# Patient Record
Sex: Female | Born: 1980 | Race: White | Hispanic: No | Marital: Married | Smoking: Former smoker
Health system: Southern US, Community
[De-identification: ages and names within clinical notes are randomized; demographics above are authoritative.]

## PROBLEM LIST (undated history)

## (undated) DIAGNOSIS — I10 Essential (primary) hypertension: Secondary | ICD-10-CM

## (undated) DIAGNOSIS — D649 Anemia, unspecified: Secondary | ICD-10-CM

## (undated) DIAGNOSIS — N3010 Interstitial cystitis (chronic) without hematuria: Secondary | ICD-10-CM

## (undated) HISTORY — DX: Anemia, unspecified: D64.9

## (undated) HISTORY — PX: OTHER SURGICAL HISTORY: SHX169

## (undated) HISTORY — DX: Essential (primary) hypertension: I10

## (undated) HISTORY — DX: Interstitial cystitis (chronic) without hematuria: N30.10

---
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
## 2000-07-24 ENCOUNTER — Other Ambulatory Visit: Admission: RE | Admit: 2000-07-24 | Discharge: 2000-07-24 | Payer: Self-pay | Admitting: Obstetrics and Gynecology

## 2004-07-14 ENCOUNTER — Other Ambulatory Visit: Admission: RE | Admit: 2004-07-14 | Discharge: 2004-07-14 | Payer: Self-pay | Admitting: Obstetrics and Gynecology

## 2005-09-13 ENCOUNTER — Encounter: Admission: RE | Admit: 2005-09-13 | Discharge: 2005-09-13 | Payer: Self-pay | Admitting: Emergency Medicine

## 2009-05-05 ENCOUNTER — Ambulatory Visit (HOSPITAL_BASED_OUTPATIENT_CLINIC_OR_DEPARTMENT_OTHER): Admission: RE | Admit: 2009-05-05 | Discharge: 2009-05-05 | Payer: Self-pay | Admitting: Urology

## 2010-03-22 LAB — POCT PREGNANCY, URINE

## 2010-06-23 ENCOUNTER — Other Ambulatory Visit (HOSPITAL_COMMUNITY): Payer: Self-pay | Admitting: Oncology

## 2010-06-23 ENCOUNTER — Ambulatory Visit (HOSPITAL_COMMUNITY)
Admission: RE | Admit: 2010-06-23 | Discharge: 2010-06-23 | Disposition: A | Discharge status: Home / Self Care | Payer: PRIVATE HEALTH INSURANCE | Source: Ambulatory Visit | Attending: Oncology | Admitting: Oncology

## 2010-06-23 ENCOUNTER — Encounter (HOSPITAL_BASED_OUTPATIENT_CLINIC_OR_DEPARTMENT_OTHER): Payer: PRIVATE HEALTH INSURANCE | Admitting: Oncology

## 2010-06-23 DIAGNOSIS — R0602 Shortness of breath: Secondary | ICD-10-CM | POA: Insufficient documentation

## 2010-06-23 DIAGNOSIS — R000 Tachycardia, unspecified: Secondary | ICD-10-CM

## 2010-06-23 DIAGNOSIS — J984 Other disorders of lung: Secondary | ICD-10-CM | POA: Insufficient documentation

## 2010-06-23 DIAGNOSIS — D649 Anemia, unspecified: Secondary | ICD-10-CM

## 2010-06-23 LAB — CBC & DIFF AND RETIC
Eosinophils Absolute: 3.1 10*3/uL — ABNORMAL HIGH (ref 0.0–0.5)
MCV: 102.6 fL — ABNORMAL HIGH (ref 79.5–101.0)
MONO%: 6.2 % (ref 0.0–14.0)
NEUT#: 6.5 10*3/uL (ref 1.5–6.5)
RBC: 3.06 10*6/uL — ABNORMAL LOW (ref 3.70–5.45)
RDW: 17.8 % — ABNORMAL HIGH (ref 11.2–14.5)
Retic %: 8.1 % — ABNORMAL HIGH (ref 0.50–1.50)
Retic Ct Abs: 247.86 10*3/uL — ABNORMAL HIGH (ref 18.30–72.70)
WBC: 12.3 10*3/uL — ABNORMAL HIGH (ref 3.9–10.3)
lymph#: 1.9 10*3/uL (ref 0.9–3.3)

## 2010-06-23 LAB — COMPREHENSIVE METABOLIC PANEL
AST: 28 U/L (ref 0–37)
Albumin: 3.8 g/dL (ref 3.5–5.2)
Alkaline Phosphatase: 62 U/L (ref 39–117)
Potassium: 3.4 mEq/L — ABNORMAL LOW (ref 3.5–5.3)
Sodium: 137 mEq/L (ref 135–145)
Total Protein: 6.1 g/dL (ref 6.0–8.3)

## 2010-06-23 LAB — MORPHOLOGY: PLT EST: ADEQUATE

## 2010-06-23 LAB — CHCC SMEAR

## 2010-06-23 MED ORDER — IOHEXOL 300 MG/ML  SOLN
100.0000 mL | Freq: Once | INTRAMUSCULAR | Status: AC | PRN
  Administered 2010-06-23: 100 mL via INTRAVENOUS

## 2010-06-28 ENCOUNTER — Encounter (HOSPITAL_BASED_OUTPATIENT_CLINIC_OR_DEPARTMENT_OTHER): Payer: PRIVATE HEALTH INSURANCE | Admitting: Oncology

## 2010-06-28 ENCOUNTER — Other Ambulatory Visit (HOSPITAL_COMMUNITY): Payer: Self-pay | Admitting: Oncology

## 2010-06-28 DIAGNOSIS — D649 Anemia, unspecified: Secondary | ICD-10-CM

## 2010-06-28 LAB — CBC & DIFF AND RETIC
Basophils Absolute: 0 10*3/uL (ref 0.0–0.1)
EOS%: 29.3 % — ABNORMAL HIGH (ref 0.0–7.0)
HCT: 28.4 % — ABNORMAL LOW (ref 34.8–46.6)
HGB: 9 g/dL — ABNORMAL LOW (ref 11.6–15.9)
Immature Retic Fract: 9.8 % (ref 0.00–10.70)
MCH: 32.1 pg (ref 25.1–34.0)
MCV: 101.4 fL — ABNORMAL HIGH (ref 79.5–101.0)
MONO%: 6.6 % (ref 0.0–14.0)
NEUT%: 42.7 % (ref 38.4–76.8)
lymph#: 2 10*3/uL (ref 0.9–3.3)

## 2010-06-28 LAB — COMPREHENSIVE METABOLIC PANEL
ALT: 28 U/L (ref 0–35)
Albumin: 3.6 g/dL (ref 3.5–5.2)
Alkaline Phosphatase: 54 U/L (ref 39–117)
CO2: 25 mEq/L (ref 19–32)
Glucose, Bld: 104 mg/dL — ABNORMAL HIGH (ref 70–99)
Potassium: 3.5 mEq/L (ref 3.5–5.3)
Sodium: 135 mEq/L (ref 135–145)
Total Bilirubin: 0.9 mg/dL (ref 0.3–1.2)
Total Protein: 6.5 g/dL (ref 6.0–8.3)

## 2010-06-28 LAB — IRON AND TIBC
TIBC: 226 ug/dL — ABNORMAL LOW (ref 250–470)
UIBC: 172 ug/dL

## 2010-06-28 LAB — MORPHOLOGY

## 2010-06-28 LAB — IMMUNOFIXATION ELECTROPHORESIS

## 2010-06-28 LAB — LACTATE DEHYDROGENASE: LDH: 545 U/L — ABNORMAL HIGH (ref 94–250)

## 2010-06-29 LAB — DIRECT ANTIGLOBULIN TEST (NOT AT ARMC): DAT IgG: NEGATIVE

## 2010-06-29 LAB — FOLATE RBC: RBC Folate: 2529 ng/mL (ref >=366)

## 2010-06-29 LAB — HAPTOGLOBIN: Haptoglobin: 7 mg/dL — ABNORMAL LOW (ref 30–200)

## 2010-07-01 ENCOUNTER — Encounter (HOSPITAL_BASED_OUTPATIENT_CLINIC_OR_DEPARTMENT_OTHER): Payer: PRIVATE HEALTH INSURANCE | Admitting: Oncology

## 2010-07-01 ENCOUNTER — Encounter (HOSPITAL_COMMUNITY)
Admission: RE | Admit: 2010-07-01 | Discharge: 2010-07-01 | Disposition: A | Discharge status: Home / Self Care | Payer: PRIVATE HEALTH INSURANCE | Source: Ambulatory Visit | Attending: Oncology | Admitting: Oncology

## 2010-07-01 ENCOUNTER — Other Ambulatory Visit (HOSPITAL_COMMUNITY): Payer: Self-pay | Admitting: Oncology

## 2010-07-01 ENCOUNTER — Telehealth: Payer: Self-pay

## 2010-07-01 DIAGNOSIS — D599 Acquired hemolytic anemia, unspecified: Secondary | ICD-10-CM | POA: Insufficient documentation

## 2010-07-01 DIAGNOSIS — D649 Anemia, unspecified: Secondary | ICD-10-CM

## 2010-07-01 LAB — CBC & DIFF AND RETIC
BASO%: 0.3 % (ref 0.0–2.0)
EOS%: 27.3 % — ABNORMAL HIGH (ref 0.0–7.0)
HCT: 26.2 % — ABNORMAL LOW (ref 34.8–46.6)
Immature Retic Fract: 19.2 % — ABNORMAL HIGH (ref 0.00–10.70)
LYMPH%: 13 % — ABNORMAL LOW (ref 14.0–49.7)
MCH: 31.7 pg (ref 25.1–34.0)
MCHC: 31.3 g/dL — ABNORMAL LOW (ref 31.5–36.0)
MCV: 101.2 fL — ABNORMAL HIGH (ref 79.5–101.0)
MONO%: 4.8 % (ref 0.0–14.0)
NEUT%: 54.6 % (ref 38.4–76.8)
Platelets: 236 10*3/uL (ref 145–400)
RBC: 2.59 10*6/uL — ABNORMAL LOW (ref 3.70–5.45)
WBC: 11.9 10*3/uL — ABNORMAL HIGH (ref 3.9–10.3)
nRBC: 0 % (ref 0–0)

## 2010-07-01 LAB — MORPHOLOGY

## 2010-07-01 NOTE — Telephone Encounter (Signed)
Left message for Suzanne Mendoza to call back and confirm that she did get the appt set up for patient on Monday 07-04-2010 at 245pm with Dr Vassie Loll

## 2010-07-04 ENCOUNTER — Other Ambulatory Visit (HOSPITAL_COMMUNITY): Payer: Self-pay | Admitting: Oncology

## 2010-07-04 ENCOUNTER — Ambulatory Visit: Payer: PRIVATE HEALTH INSURANCE | Admitting: Pulmonary Disease

## 2010-07-04 ENCOUNTER — Ambulatory Visit (INDEPENDENT_AMBULATORY_CARE_PROVIDER_SITE_OTHER): Payer: PRIVATE HEALTH INSURANCE | Admitting: Pulmonary Disease

## 2010-07-04 ENCOUNTER — Encounter (HOSPITAL_BASED_OUTPATIENT_CLINIC_OR_DEPARTMENT_OTHER): Payer: PRIVATE HEALTH INSURANCE | Admitting: Oncology

## 2010-07-04 ENCOUNTER — Encounter: Payer: Self-pay | Admitting: Pulmonary Disease

## 2010-07-04 VITALS — BP 110/68 | HR 110 | Temp 98.2°F | Ht 63.5 in | Wt 140.0 lb

## 2010-07-04 DIAGNOSIS — D649 Anemia, unspecified: Secondary | ICD-10-CM

## 2010-07-04 DIAGNOSIS — R0902 Hypoxemia: Secondary | ICD-10-CM

## 2010-07-04 LAB — BASIC METABOLIC PANEL
CO2: 26 mEq/L (ref 19–32)
Chloride: 105 mEq/L (ref 96–112)
Creatinine, Ser: 0.6 mg/dL (ref 0.50–1.10)
Glucose, Bld: 134 mg/dL — ABNORMAL HIGH (ref 70–99)
Sodium: 138 mEq/L (ref 135–145)

## 2010-07-04 LAB — CBC & DIFF AND RETIC
BASO%: 0.4 % (ref 0.0–2.0)
Basophils Absolute: 0 10*3/uL (ref 0.0–0.1)
EOS%: 4.5 % (ref 0.0–7.0)
Eosinophils Absolute: 0.4 10*3/uL (ref 0.0–0.5)
HGB: 8.4 g/dL — ABNORMAL LOW (ref 11.6–15.9)
LYMPH%: 26.8 % (ref 14.0–49.7)
MCV: 102.6 fL — ABNORMAL HIGH (ref 79.5–101.0)
MONO#: 0.4 10*3/uL (ref 0.1–0.9)
MONO%: 4.9 % (ref 0.0–14.0)
NEUT#: 5.8 10*3/uL (ref 1.5–6.5)
Platelets: 274 10*3/uL (ref 145–400)
RBC: 2.5 10*6/uL — ABNORMAL LOW (ref 3.70–5.45)
Retic Ct Abs: 199.75 10*3/uL — ABNORMAL HIGH (ref 18.30–72.70)
WBC: 9.1 10*3/uL (ref 3.9–10.3)

## 2010-07-04 NOTE — Progress Notes (Incomplete Revision)
?  Subjective:  ? ? Patient ID: Suzanne Mendoza, female    DOB: 05/03/1980, 30 y.o.   MRN: [id number] ? ?HPI ?30/F, non smoker, undergoing work up for  hemolytic anemia is referred for evaluation of hypoxia on pulse oximetry. 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 Of note ANa is negative. DAT , igG & complement were negative. Resting pulse oximeter has been documented low & is at 91% today. Of note Ct angio neg 06/23/10. She underwent cystoscopy by Dr Ovidio Hanger for interstitial cystitis in may'11. ?She was evaluated for asthma 7-8 years ago & had normal PFTs at that time by report. ?She denies wheezing or cough. She reports dyspnea on activity but no pedal edema, palpitations, cyanosis or chest pain. There is no history of frequent LRTIs ?ABG shows nml pO2, methhemoglobin was 5.4, carboxy 1.1 ? ?Review of Systems  ?Constitutional: Positive for unexpected weight change. Negative for fever.  ?HENT: Negative for ear pain, nosebleeds, congestion, sore throat, rhinorrhea, sneezing, trouble swallowing, dental problem, postnasal drip and sinus pressure.   ?Eyes: Negative for redness and itching.  ?Respiratory: Positive for cough and shortness of breath. Negative for chest tightness and wheezing.   ?Cardiovascular: Positive for palpitations. Negative for leg swelling.  ?Gastrointestinal: Positive for abdominal pain. Negative for nausea and vomiting.  ?Genitourinary: Negative for dysuria.  ?Musculoskeletal: Positive for joint swelling.  ?Skin: Negative for rash.  ?Neurological: Positive for headaches.  ?Hematological: Does not bruise/bleed easily.  ?Psychiatric/Behavioral: Negative for dysphoric mood. The patient is nervous/anxious.   ? ? ?   ?Objective:  ? Physical Exam ?Gen. Pleasant, well-nourished, in no distress, normal affect ?ENT - no lesions, no post nasal drip, pallor + ?Neck: No JVD, no thyromegaly, no carotid bruits ? Lungs: no use of accessory muscles, no dullness to percussion, clear without rales or rhonchi  ?Cardiovascular: Rhy

## 2010-07-04 NOTE — Progress Notes (Addendum)
  Subjective:    Patient ID: Suzanne Mendoza, female    DOB: 1980/09/07, 30 y.o.   MRN: [id number]  HPI 30/F, non smoker, undergoing work up for  hemolytic anemia is referred for evaluation of hypoxia on pulse oximetry. 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 Of note ANa is negative. DAT , igG & complement were negative. Resting pulse oximeter has been documented low & is at 91% today. Of note Ct angio neg 06/23/10. She underwent cystoscopy by Dr Ovidio Hanger for interstitial cystitis in may'11. She was evaluated for asthma 7-8 years ago & had normal PFTs at that time by report. She denies wheezing or cough. She reports dyspnea on activity but no pedal edema, palpitations, cyanosis or chest pain. There is no history of frequent LRTIs ABG shows nml pO2, methhemoglobin was 5.4, carboxy 1.1  Review of Systems  Constitutional: Positive for unexpected weight change. Negative for fever.  HENT: Negative for ear pain, nosebleeds, congestion, sore throat, rhinorrhea, sneezing, trouble swallowing, dental problem, postnasal drip and sinus pressure.   Eyes: Negative for redness and itching.  Respiratory: Positive for cough and shortness of breath. Negative for chest tightness and wheezing.   Cardiovascular: Positive for palpitations. Negative for leg swelling.  Gastrointestinal: Positive for abdominal pain. Negative for nausea and vomiting.  Genitourinary: Negative for dysuria.  Musculoskeletal: Positive for joint swelling.  Skin: Negative for rash.  Neurological: Positive for headaches.  Hematological: Does not bruise/bleed easily.  Psychiatric/Behavioral: Negative for dysphoric mood. The patient is nervous/anxious.        Objective:   Physical Exam Gen. Pleasant, well-nourished, in no distress, normal affect ENT - no lesions, no post nasal drip, pallor + Neck: No JVD, no thyromegaly, no carotid bruits Lungs: no use of accessory muscles, no dullness to percussion,  clear without rales or rhonchi  Cardiovascular: Rhythm regular, heart sounds  normal, no murmurs or gallops, no peripheral edema Abdomen: soft and non-tender, no hepatomegaly, mild 2 finger palpable tender spleen, BS normal. Musculoskeletal: No deformities, no cyanosis or clubbing Neuro:  alert, non focal        Assessment & Plan:

## 2010-07-04 NOTE — Patient Instructions (Signed)
Arterial blood gas test If confirms low oxygen level, may need echo to look for shunt in the heart Follow up based on this test

## 2010-07-06 DIAGNOSIS — R0902 Hypoxemia: Secondary | ICD-10-CM | POA: Insufficient documentation

## 2010-07-06 NOTE — Assessment & Plan Note (Signed)
Low saturation on oximetry is well documented in hemolytic anemias. 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 The simplest way to confirm this would be with a blood gas & co-oximetry - if pO2 is normal it would  Confirm thatt he low measurement is due to abnormal hemoglobins. If on the other hand , pO2 is low then we can consider echo with bubble study to r/o right to left shunt.

## 2010-07-07 ENCOUNTER — Encounter (HOSPITAL_COMMUNITY): Payer: PRIVATE HEALTH INSURANCE

## 2010-07-07 ENCOUNTER — Ambulatory Visit (HOSPITAL_COMMUNITY): Admission: RE | Admit: 2010-07-07 | Payer: PRIVATE HEALTH INSURANCE | Source: Ambulatory Visit

## 2010-07-07 LAB — PNH PROFILE (-HIGH SENSITIVITY): Viability (%): 54 %

## 2010-07-07 LAB — COMPREHENSIVE METABOLIC PANEL
BUN: 12 mg/dL (ref 6–23)
CO2: 23 mEq/L (ref 19–32)
Calcium: 9.1 mg/dL (ref 8.4–10.5)
Chloride: 106 mEq/L (ref 96–112)
Creatinine, Ser: 0.61 mg/dL (ref 0.50–1.10)
Total Bilirubin: 0.9 mg/dL (ref 0.3–1.2)

## 2010-07-07 LAB — LACTATE DEHYDROGENASE: LDH: 419 U/L — ABNORMAL HIGH (ref 94–250)

## 2010-07-08 ENCOUNTER — Other Ambulatory Visit (HOSPITAL_COMMUNITY): Payer: Self-pay | Admitting: Oncology

## 2010-07-08 ENCOUNTER — Ambulatory Visit (HOSPITAL_COMMUNITY)
Admission: RE | Admit: 2010-07-08 | Discharge: 2010-07-08 | Disposition: A | Discharge status: Home / Self Care | Payer: PRIVATE HEALTH INSURANCE | Source: Ambulatory Visit | Attending: Pulmonary Disease | Admitting: Pulmonary Disease

## 2010-07-08 ENCOUNTER — Encounter (HOSPITAL_BASED_OUTPATIENT_CLINIC_OR_DEPARTMENT_OTHER): Payer: PRIVATE HEALTH INSURANCE | Admitting: Oncology

## 2010-07-08 DIAGNOSIS — R0602 Shortness of breath: Secondary | ICD-10-CM | POA: Insufficient documentation

## 2010-07-08 DIAGNOSIS — R161 Splenomegaly, not elsewhere classified: Secondary | ICD-10-CM

## 2010-07-08 DIAGNOSIS — D649 Anemia, unspecified: Secondary | ICD-10-CM

## 2010-07-08 LAB — CBC & DIFF AND RETIC
Basophils Absolute: 0 10*3/uL (ref 0.0–0.1)
EOS%: 5.7 % (ref 0.0–7.0)
LYMPH%: 28 % (ref 14.0–49.7)
MCH: 32.4 pg (ref 25.1–34.0)
MCV: 101.8 fL — ABNORMAL HIGH (ref 79.5–101.0)
MONO%: 8 % (ref 0.0–14.0)
Platelets: 294 10*3/uL (ref 145–400)
RBC: 2.81 10*6/uL — ABNORMAL LOW (ref 3.70–5.45)
RDW: 15.8 % — ABNORMAL HIGH (ref 11.2–14.5)
Retic %: 10.65 % — ABNORMAL HIGH (ref 0.50–1.50)
nRBC: 0 % (ref 0–0)

## 2010-07-08 LAB — LACTATE DEHYDROGENASE: LDH: 347 U/L — ABNORMAL HIGH (ref 94–250)

## 2010-07-08 LAB — CARBOXYHEMOGLOBIN
Carboxyhemoglobin: 1.1 % (ref 0.5–1.5)
O2 Saturation: 93.4 %
Total hemoglobin: 8.9 g/dL — ABNORMAL LOW (ref 12.5–16.0)

## 2010-07-08 LAB — BLOOD GAS, ARTERIAL
Bicarbonate: 23.6 mEq/L (ref 20.0–24.0)
Drawn by: 24601
pCO2 arterial: 36.5 mmHg (ref 35.0–45.0)
pH, Arterial: 7.427 — ABNORMAL HIGH (ref 7.350–7.400)
pO2, Arterial: 93 mmHg (ref 80.0–100.0)

## 2010-07-08 LAB — COMPREHENSIVE METABOLIC PANEL
AST: 19 U/L (ref 0–37)
Albumin: 4.2 g/dL (ref 3.5–5.2)
Alkaline Phosphatase: 57 U/L (ref 39–117)
BUN: 14 mg/dL (ref 6–23)
Potassium: 3.5 mEq/L (ref 3.5–5.3)
Sodium: 139 mEq/L (ref 135–145)
Total Protein: 6.3 g/dL (ref 6.0–8.3)

## 2010-07-08 LAB — TECHNOLOGIST REVIEW

## 2010-07-08 NOTE — Progress Notes (Signed)
Quick Note:  I informed pt of RA's findings and recommendations. Pt verbalized understanding  ______ 

## 2010-07-13 ENCOUNTER — Other Ambulatory Visit (HOSPITAL_COMMUNITY): Payer: PRIVATE HEALTH INSURANCE

## 2010-07-14 ENCOUNTER — Observation Stay (HOSPITAL_COMMUNITY)
Admission: EM | Admit: 2010-07-14 | Discharge: 2010-07-15 | Disposition: A | Discharge status: Home / Self Care | Payer: PRIVATE HEALTH INSURANCE | Attending: Internal Medicine | Admitting: Internal Medicine

## 2010-07-14 DIAGNOSIS — Z79899 Other long term (current) drug therapy: Secondary | ICD-10-CM | POA: Insufficient documentation

## 2010-07-14 DIAGNOSIS — Z01812 Encounter for preprocedural laboratory examination: Secondary | ICD-10-CM | POA: Insufficient documentation

## 2010-07-14 DIAGNOSIS — F411 Generalized anxiety disorder: Secondary | ICD-10-CM | POA: Insufficient documentation

## 2010-07-14 DIAGNOSIS — E876 Hypokalemia: Secondary | ICD-10-CM | POA: Insufficient documentation

## 2010-07-14 DIAGNOSIS — D599 Acquired hemolytic anemia, unspecified: Secondary | ICD-10-CM | POA: Insufficient documentation

## 2010-07-14 DIAGNOSIS — J45909 Unspecified asthma, uncomplicated: Secondary | ICD-10-CM | POA: Insufficient documentation

## 2010-07-14 DIAGNOSIS — IMO0002 Reserved for concepts with insufficient information to code with codable children: Secondary | ICD-10-CM | POA: Insufficient documentation

## 2010-07-14 DIAGNOSIS — M79609 Pain in unspecified limb: Principal | ICD-10-CM | POA: Insufficient documentation

## 2010-07-14 DIAGNOSIS — D72829 Elevated white blood cell count, unspecified: Secondary | ICD-10-CM | POA: Insufficient documentation

## 2010-07-14 LAB — CBC
Hemoglobin: 9.9 g/dL — ABNORMAL LOW (ref 12.0–15.0)
MCH: 32.6 pg (ref 26.0–34.0)
Platelets: 273 10*3/uL (ref 150–400)
RBC: 3.04 MIL/uL — ABNORMAL LOW (ref 3.87–5.11)
WBC: 10.7 10*3/uL — ABNORMAL HIGH (ref 4.0–10.5)

## 2010-07-14 LAB — URINALYSIS, ROUTINE W REFLEX MICROSCOPIC
Bilirubin Urine: NEGATIVE
Leukocytes, UA: NEGATIVE
Nitrite: NEGATIVE
Specific Gravity, Urine: 1.019 (ref 1.005–1.030)
Urobilinogen, UA: 1 mg/dL (ref 0.0–1.0)
pH: 7 (ref 5.0–8.0)

## 2010-07-14 LAB — DIFFERENTIAL
Basophils Absolute: 0 10*3/uL (ref 0.0–0.1)
Eosinophils Relative: 4 % (ref 0–5)
Lymphocytes Relative: 25 % (ref 12–46)
Monocytes Absolute: 1.1 10*3/uL — ABNORMAL HIGH (ref 0.1–1.0)

## 2010-07-14 LAB — BASIC METABOLIC PANEL
BUN: 16 mg/dL (ref 6–23)
Chloride: 102 mEq/L (ref 96–112)
GFR calc Af Amer: >60 mL/min (ref >60)
GFR calc non Af Amer: >60 mL/min (ref >60)
Potassium: 2.7 mEq/L — CL (ref 3.5–5.1)

## 2010-07-14 LAB — MAGNESIUM: Magnesium: 1.9 mg/dL (ref 1.5–2.5)

## 2010-07-14 LAB — CK: Total CK: 39 U/L (ref 7–177)

## 2010-07-14 LAB — RETICULOCYTES: RBC.: 3.02 MIL/uL — ABNORMAL LOW (ref 3.87–5.11)

## 2010-07-14 LAB — POCT PREGNANCY, URINE: Preg Test, Ur: NEGATIVE

## 2010-07-14 NOTE — H&P (Signed)
NAME:  ALEESIA, HENNEY NO.:  [id number]  MEDICAL RECORD NO.:  [id number]  LOCATION:  [facility]                         FACILITY:  [facility]  PHYSICIAN:  Andreas Blower, MD       DATE OF BIRTH:  1980/01/20  DATE OF ADMISSION:  07/14/2010 DATE OF DISCHARGE:                             HISTORY & PHYSICAL   PRIMARY CARE PHYSICIAN:  Jamey Reas , M.D. with [facility].  RHEUMATOLOGIST:  Lendon Colonel, M.D.  HEMATOLOGY ONCOLOGIST:  Samul Dada, M.D.  CHIEF COMPLAINT:  Bilateral lower extremity pain.  HISTORY OF PRESENT ILLNESS:  Ms. Amesquita is a 30 year old Caucasian female with history of asthma, recent diagnosis of hemolytic anemia, who reports that she has been having cramping pain in her legs since February 2011.  She reported that she has been feeling tired and went to see Dr. Cyndia Bent and was found to be anemic, who in turn referred the patient to Dr. Arline Asp and was recently diagnosed with hemolytic anemia on June 23, 2010.  She was started on prednisone subsequently since then she has noted that she has been having increasing cramping like pain in bilateral lower extremities.  She reports that her symptoms are happening throughout the day, but more prominent during the night.  She reports that after she goes to bed, she notes that she has pain in her legs that are sharp enough to wake her up from sleep.  The pain lasts for about 5-10 minutes before it gets better.  She reports because of the severe pain, she had presented to the ER for further evaluation.  In the ER, her bilateral leg pains have improved.  She was found to be hypokalemic with the potassium of 2.7.  Given her pain was uncontrolled during the night, the hospitalist service was requested to admit her for observation for closer monitoring.  Patient denies any recent fevers or chills.  Does feel nauseated at times.  Had dry hives about a week ago. Also, had some shortness of breath  about a week ago.  Had diarrhea few days ago that has resolved.  Denies any abdominal pain.  Denies any headaches or vision changes.  REVIEW OF SYSTEMS:  All systems were reviewed with the patient and was positive as per HPI, otherwise all other systems are negative.  SOCIAL HISTORY:  The patient quit smoking in February 2010.  Does not drink any alcohol.  Currently, is a Associate Professor at [employer].  FAMILY HISTORY:  Had a brother, who died in his 30s from renal cancer and has mother who has hypothyroidism.  Father with hypothyroidism.  PAST MEDICAL HISTORY: 1. Recent diagnosis of hemolytic anemia. 2. Anxiety.  HOME MEDICATIONS: 1. Xanax 0.5 mg p.o. daily at bedtime as needed. 2. Amitriptyline 25 mg p.o. daily at bedtime. 3. Clonazepam 0.5 mg p.o. daily at bedtime as needed. 4. Flexeril 10 mg p.o. daily at bedtime as needed. 5. Hydrocodone/acetaminophen 10/325 one tablet p.o. q.4h. as needed     for pain. 6. Hydroxyzine 25 mg p.o. daily at bedtime. 7. Prednisone 40 mg p.o. daily 8. Phenazopyridine p.o. daily.  PHYSICAL EXAMINATION:  VITAL SIGNS:  Temperature 98.0, blood pressure 111/70, heart rate 92, respiration  18, satting at 94% on room air. GENERAL:  Patient was awake, oriented, was anxious in appearance.  Was laying in bed. HEENT:  Extraocular motions are intact.  Pupils equal and round.  Had slightly dry mucous membranes. NECK:  Supple. HEART:  Regular with S1, S2. LUNGS:  Clear to auscultation bilaterally. ABDOMEN:  Soft, nontender, nondistended.  Positive bowel sounds. EXTREMITIES:  Patient had good peripheral pulses with trace edema. NEUROLOGIC:  Cranial nerves II through XII grossly intact.  Had 5/5 motor strength in upper as well as lower extremities.  LABORATORY DATA:  CBC shows a white count of 10.7, hemoglobin 9.9, hematocrit 30.7, platelet count 273,000.  Electrolytes normal except potassium is 2.7.  CK is 39.  UA is negative for nitrites  and leukocytes.  ASSESSMENT AND PLAN: 1. Bilateral lower extremity pain:  Etiology unclear.  Patient reports     that the pain may be due to the cramps at times; however, this new     episode is different from her previous episodes of cramping.     Uncertain if the hypokalemia is causing her leg pain.  We will     aggressively replace her potassium and see if her leg pain     improves.  Patient had computed tomography of the chest with     contrast to evaluate for pulmonary embolism on June 23, 2010,     which was negative.  We will get lower extremity Dopplers to     evaluate for any deep venous thrombosis in lower extremities. Will     talk to Dr. Lendon Colonel with rheumatology in the morning for further     input.  Unsure if the patient warrants neurology evaluation.  May     consider neurology evaluation after discussing with Dr. Lendon Colonel. 2. Hypokalemia:  We will aggressively replace.  We will check     patient's magnesium and if less than 3.5, we will replace. 3. Hemolytic anemia:  Continue steroids. 4. Leukocytosis:  Likely due to steroids.  Monitor for now.     Urinalysis negative. 5. Anxiety: Benzo as needed. 6. Prophylaxis:  Sequential compression devices. 7. Code status:  Patient is full code.  Time spent on admission, talking to the patient, family and coordinating care was 1 hour.   Andreas Blower, MD   SR/MEDQ  D:  07/14/2010  T:  07/14/2010  Job:  [id number]  Electronically Signed by Wardell Heath Maleke Feria  on 07/14/2010 08:54:52 PM

## 2010-07-15 ENCOUNTER — Other Ambulatory Visit (HOSPITAL_COMMUNITY): Payer: Self-pay | Admitting: Oncology

## 2010-07-15 ENCOUNTER — Ambulatory Visit (HOSPITAL_COMMUNITY)
Admission: RE | Admit: 2010-07-15 | Discharge: 2010-07-15 | Disposition: A | Discharge status: Home / Self Care | Payer: PRIVATE HEALTH INSURANCE | Source: Ambulatory Visit | Attending: Oncology | Admitting: Oncology

## 2010-07-15 DIAGNOSIS — R161 Splenomegaly, not elsewhere classified: Secondary | ICD-10-CM

## 2010-07-15 LAB — BASIC METABOLIC PANEL
BUN: 13 mg/dL (ref 6–23)
CO2: 25 mEq/L (ref 19–32)
CO2: 26 mEq/L (ref 19–32)
Chloride: 105 mEq/L (ref 96–112)
Chloride: 106 mEq/L (ref 96–112)
Creatinine, Ser: <0.47 mg/dL — ABNORMAL LOW (ref 0.50–1.10)
Glucose, Bld: 124 mg/dL — ABNORMAL HIGH (ref 70–99)
Glucose, Bld: 90 mg/dL (ref 70–99)
Sodium: 135 mEq/L (ref 135–145)

## 2010-07-15 LAB — CBC
HCT: 29 % — ABNORMAL LOW (ref 36.0–46.0)
MCH: 32.5 pg (ref 26.0–34.0)
MCV: 101.4 fL — ABNORMAL HIGH (ref 78.0–100.0)
RBC: 2.86 MIL/uL — ABNORMAL LOW (ref 3.87–5.11)
WBC: 11 10*3/uL — ABNORMAL HIGH (ref 4.0–10.5)

## 2010-07-15 LAB — T4, FREE: Free T4: 0.73 ng/dL — ABNORMAL LOW (ref 0.80–1.80)

## 2010-07-16 NOTE — Discharge Summary (Signed)
NAME:  Suzanne Mendoza, Suzanne Mendoza NO.:  [id number]  MEDICAL RECORD NO.:  [id number]  LOCATION:  1308                         FACILITY:  [facility]  PHYSICIAN:  Suzanne Blower, MD       DATE OF BIRTH:  June 05, 1980  DATE OF ADMISSION:  07/14/2010 DATE OF DISCHARGE:                              DISCHARGE SUMMARY   PRIMARY CARE PHYSICIAN:  Suzanne Reas, MD  RHEUMATOLOGIST:  Dr. Lendon Mendoza.  HEMATOLOGY ONCOLOGIST:  Suzanne Dada, MD  DISCHARGE DIAGNOSES: 1. Bilateral lower extremity pain. 2. Hypokalemia, resolved. 3. Recent diagnosis of hemolytic anemia. 4. Leukocytosis, likely due to steroids. 5. History of asthma. 6. Anxiety.  DISCHARGE MEDICATIONS: 1. Potassium chloride 20 mEq tablet increased from 1 to 2 tablets     p.o. daily 2. Albuterol inhaler 1 to 2 puffs every 4 to 6 hours as needed for     shortness of breath. 3. Hydroxyzine 25 mg p.o. daily at bedtime. 4. Phenazopyridine 97.5 mg 1 tablet every 3 to 4 hours as needed for     urinary pain. 5. Compound bladder irrigation.  Heparin 40,000 units, lidocaine 2% 10     mL, sodium bicarbonate 8.4% 5 mL. 6. Amitriptyline 25 mg p.o. daily at bedtime. 7. Cyclobenzaprine 10 mg p.o. daily at bedtime as needed for muscle     spasms. 8. Hydrocodone/APAP 10/325 mg 1/2 tablet to 1 tablet every 4 hours as     needed for pain. 9. Clonazepam 0.5 mg twice daily as needed for cramps. 10.Prednisone 20 mg p.o. 3 times a day. 11.Alprazolam 0.5 mg daily at bedtime as needed.  BRIEF ADMITTING HISTORY AND PHYSICAL:  Suzanne Mendoza is a 30 year old Caucasian female with history of asthma, recent diagnosis of hemolytic anemia, anxiety, presented to the ER on July 14, 2010, with complaints of bilateral lower extremity pain.  RADIOLOGY/IMAGING:  No radiology imaging.  LABORATORY DATA:  CBC shows a white count of 11.0, hemoglobin 9.3, hematocrit 29.0, platelet count 264.  Electrolytes normal except BUN is 13, creatinine less than  0.47.  Initial potassium on presentation was 2.7.  UA is negative for nitrites and leukocytes.  CK is 39.  Urine pregnancy was negative.  HOSPITAL COURSE BY PROBLEM: 1. Bilateral lower extremity pain.  Etiology unclear.  The patient's     CK was normal, not suggestive of rhabdomyolysis.  Uncertain if the     patient had bilateral lower extremity pain due to hypokalemia.  As     the potassium was aggressively replaced, the patient did not have     any further episodes during the course of hospital stay.  Also     uncertain if the patient's sequential compression stockings helped     with the pain.  The patient had been ordered lower extremity     Dopplers, but have low suspicion for DVT as the patient is active     and does not have risk factors suggesting DVT.  During the course     of hospital stay, the patient reports that she had intermittent     cramping, but nowhere severe pain that she had prior to     presentation.  At the time of discharge, the  patient's potassium     chloride dose was increased from 20 mEq to 40 mEq daily.  The     patient was also instructed to follow up at [facility] or medical     store to see if sequential compression device could be obtained     for home use and to help with the leg pain.  The patient to     also follow with Dr. Lendon Mendoza with  Rheumatology as an outpatient. 2. Hypokalemia, replace as needed. 3. Hemolytic anemia.  Continue steroids.  Continue to follow with Dr.     Arline Mendoza as an outpatient. 4. Leukocytosis likely due to steroids.  UA is negative. 5. Anxiety.  Continue p.r.n. benzodiazepines as needed. 6. Asthma.  Continue p.r.n. albuterol inhaler.  Time spent on discharge, talking to the patient and coordinating care was 25 minutes.   Suzanne Blower, MD   SR/MEDQ  D:  07/15/2010  T:  07/15/2010  Job:  [id number]  Electronically Signed by Wardell Heath Assad Harbeson  on 07/16/2010 03:34:43 PM

## 2010-07-18 ENCOUNTER — Encounter (HOSPITAL_BASED_OUTPATIENT_CLINIC_OR_DEPARTMENT_OTHER): Payer: PRIVATE HEALTH INSURANCE | Admitting: Oncology

## 2010-07-18 ENCOUNTER — Other Ambulatory Visit (HOSPITAL_COMMUNITY): Payer: Self-pay | Admitting: Oncology

## 2010-07-18 DIAGNOSIS — D649 Anemia, unspecified: Secondary | ICD-10-CM

## 2010-07-18 LAB — CBC & DIFF AND RETIC
BASO%: 0.3 % (ref 0.0–2.0)
Basophils Absolute: 0 10*3/uL (ref 0.0–0.1)
Eosinophils Absolute: 1.4 10*3/uL — ABNORMAL HIGH (ref 0.0–0.5)
HCT: 33.6 % — ABNORMAL LOW (ref 34.8–46.6)
HGB: 10.9 g/dL — ABNORMAL LOW (ref 11.6–15.9)
Immature Retic Fract: 5.2 % (ref 0.00–10.70)
MCHC: 32.4 g/dL (ref 31.5–36.0)
MONO#: 0.8 10*3/uL (ref 0.1–0.9)
NEUT#: 7.6 10*3/uL — ABNORMAL HIGH (ref 1.5–6.5)
NEUT%: 67.2 % (ref 38.4–76.8)
WBC: 11.3 10*3/uL — ABNORMAL HIGH (ref 3.9–10.3)
lymph#: 1.6 10*3/uL (ref 0.9–3.3)

## 2010-07-18 LAB — LACTATE DEHYDROGENASE: LDH: 276 U/L — ABNORMAL HIGH (ref 94–250)

## 2010-07-26 ENCOUNTER — Other Ambulatory Visit (HOSPITAL_COMMUNITY): Payer: Self-pay | Admitting: Oncology

## 2010-07-26 ENCOUNTER — Encounter (HOSPITAL_BASED_OUTPATIENT_CLINIC_OR_DEPARTMENT_OTHER): Payer: PRIVATE HEALTH INSURANCE | Admitting: Oncology

## 2010-07-26 DIAGNOSIS — D649 Anemia, unspecified: Secondary | ICD-10-CM

## 2010-07-26 LAB — CBC & DIFF AND RETIC
BASO%: 0.3 % (ref 0.0–2.0)
LYMPH%: 24 % (ref 14.0–49.7)
MCHC: 33 g/dL (ref 31.5–36.0)
MONO#: 0.7 10*3/uL (ref 0.1–0.9)
MONO%: 7.4 % (ref 0.0–14.0)
Platelets: 212 10*3/uL (ref 145–400)
RBC: 3.86 10*6/uL (ref 3.70–5.45)
Retic %: 1.34 % (ref 0.50–1.50)
WBC: 9.1 10*3/uL (ref 3.9–10.3)

## 2010-07-27 LAB — POTASSIUM: Potassium: 3.7 mEq/L (ref 3.5–5.3)

## 2010-09-23 ENCOUNTER — Other Ambulatory Visit (HOSPITAL_COMMUNITY): Payer: Self-pay | Admitting: Oncology

## 2010-09-23 ENCOUNTER — Encounter (HOSPITAL_BASED_OUTPATIENT_CLINIC_OR_DEPARTMENT_OTHER): Payer: PRIVATE HEALTH INSURANCE | Admitting: Oncology

## 2010-09-23 DIAGNOSIS — D649 Anemia, unspecified: Secondary | ICD-10-CM

## 2010-09-23 LAB — COMPREHENSIVE METABOLIC PANEL
Albumin: 4 g/dL (ref 3.5–5.2)
Alkaline Phosphatase: 69 U/L (ref 39–117)
BUN: 12 mg/dL (ref 6–23)
Calcium: 9.2 mg/dL (ref 8.4–10.5)
Glucose, Bld: 100 mg/dL — ABNORMAL HIGH (ref 70–99)
Potassium: 3.9 mEq/L (ref 3.5–5.3)

## 2010-09-23 LAB — CBC & DIFF AND RETIC
BASO%: 0.4 % (ref 0.0–2.0)
HCT: 38.7 % (ref 34.8–46.6)
Immature Retic Fract: 4.3 % (ref 1.60–10.00)
MCHC: 35.4 g/dL (ref 31.5–36.0)
MONO#: 0.4 10*3/uL (ref 0.1–0.9)
RBC: 4.53 10*6/uL (ref 3.70–5.45)
Retic %: 0.94 % (ref 0.70–2.10)
Retic Ct Abs: 42.58 10*3/uL (ref 33.70–90.70)
WBC: 5.3 10*3/uL (ref 3.9–10.3)
lymph#: 2.3 10*3/uL (ref 0.9–3.3)
nRBC: 0 % (ref 0–0)

## 2010-09-27 LAB — GLUCOSE 6 PHOSPHATE DEHYDROGENASE: G-6PDH: 12.1 U/GM HGB (ref 7.0–20.5)

## 2010-11-21 ENCOUNTER — Other Ambulatory Visit (HOSPITAL_COMMUNITY): Payer: Self-pay | Admitting: Oncology

## 2010-11-21 ENCOUNTER — Other Ambulatory Visit (HOSPITAL_BASED_OUTPATIENT_CLINIC_OR_DEPARTMENT_OTHER): Payer: PRIVATE HEALTH INSURANCE | Admitting: Lab

## 2010-11-21 ENCOUNTER — Ambulatory Visit (HOSPITAL_BASED_OUTPATIENT_CLINIC_OR_DEPARTMENT_OTHER): Payer: PRIVATE HEALTH INSURANCE | Admitting: Oncology

## 2010-11-21 VITALS — BP 128/77 | HR 105 | Temp 97.2°F | Ht 63.5 in | Wt 140.5 lb

## 2010-11-21 DIAGNOSIS — R252 Cramp and spasm: Secondary | ICD-10-CM

## 2010-11-21 DIAGNOSIS — N3010 Interstitial cystitis (chronic) without hematuria: Secondary | ICD-10-CM

## 2010-11-21 DIAGNOSIS — D594 Other nonautoimmune hemolytic anemias: Secondary | ICD-10-CM

## 2010-11-21 DIAGNOSIS — D592 Drug-induced nonautoimmune hemolytic anemia: Secondary | ICD-10-CM | POA: Insufficient documentation

## 2010-11-21 LAB — COMPREHENSIVE METABOLIC PANEL
ALT: 16 U/L (ref 0–35)
Alkaline Phosphatase: 56 U/L (ref 39–117)
CO2: 26 mEq/L (ref 19–32)
Creatinine, Ser: 0.61 mg/dL (ref 0.50–1.10)
Sodium: 139 mEq/L (ref 135–145)
Total Bilirubin: 0.8 mg/dL (ref 0.3–1.2)

## 2010-11-21 LAB — CBC & DIFF AND RETIC
BASO%: 0.2 % (ref 0.0–2.0)
EOS%: 1.5 % (ref 0.0–7.0)
HCT: 38.3 % (ref 34.8–46.6)
Immature Retic Fract: 3.4 % (ref 1.60–10.00)
MCH: 30.8 pg (ref 25.1–34.0)
MCHC: 35.8 g/dL (ref 31.5–36.0)
MONO#: 0.3 10*3/uL (ref 0.1–0.9)
NEUT%: 53.7 % (ref 38.4–76.8)
RBC: 4.45 10*6/uL (ref 3.70–5.45)
RDW: 13.4 % (ref 11.2–14.5)
Retic %: 1.77 % (ref 0.70–2.10)
WBC: 4.8 10*3/uL (ref 3.9–10.3)
lymph#: 1.8 10*3/uL (ref 0.9–3.3)

## 2010-11-21 LAB — LACTATE DEHYDROGENASE: LDH: 175 U/L (ref 94–250)

## 2010-11-21 NOTE — Progress Notes (Signed)
This office note has been dictated.  #[id number]

## 2010-11-21 NOTE — Progress Notes (Signed)
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  Suzanne Mendoza was last seen by [place] on 09/23/2010.  She has been off of prednisone for the past 3 months.  Her condition has improved dramatically.  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  She is still having some intermittent cramps, but these are occurring far less frequently and are for less severe than they had been.  Previously they have been fairly disabling.  Suzanne Mendoza is no longer taking Pyridium.  She has a diagnosis of interstitial cystitis and is carrying out bladder instillations 2 to 3 times a week with good control of her symptoms. She is really without any new complaints today.  Overall seems to be much improved.  MEDICATIONS:  Medicines are reviewed and recorded.  These include Xanax, Elavil, Flexeril, hydrocodone, Atarax.  ALLERGIES:  Cephalosporins, sulfa antibiotics, tramadol, all of which cause hives.  We added Pyridium, i.e. phenazopyridine to her allergy list as the cause of her previous oxidative hemolytic anemia.  PHYSICAL EXAMINATION:  General:  Suzanne Mendoza looks well.  She is in good spirits today.  Vital Signs:  Her weight is stable at 140 pounds 8 ounces, height 5 feet 3-1/2 inches, body surface area 1.69 sq m .  Blood pressure 128/77 in the right arm sitting.  Other vital signs are normal. Her pulse was 105 and regular.  O2 saturation on room air at rest was 99%.  Her physical exam shows no changes from those carried out previously.  LABORATORY DATA:  Today white count 4.8, ANC 2.6, hemoglobin 13.7, hematocrit 38.3, and platelets 197,000.  Retic count was 1.77% with an absolute of 78.77.  That is normal.  Chemistries on 09/23/2010 were essentially normal except for a glucose of 100.  We have a G6PD level that came back in late September of 12.1.  The  normal range was defined as 7.0 to 20.5 units/g of hemoglobin and thus, it appears that the patient does not have a G6PD deficiency from this determination carried out at a time when she was not having ongoing hemolysis.  IMPRESSION AND PLAN:  Suzanne Mendoza seems to be doing well at this time.  I think the problem with her oxidative hemolysis secondary Pyridium has resolved.  Suzanne Mendoza understands not to take anymore Pyridium or any type of sulfa drugs.  She tells me that she will be seeing Dr. Avie Echevaria for further evaluation of her muscle cramps.  Suzanne Mendoza was not given a return appointment, but I will be happy to see her if any concerns or questions arise in the future.  I am happy that she is feeling so much better.    ______________________________ Samul Dada, M.D. DSM/MEDQ  D:  11/21/2010  T:  11/21/2010  Job:  [id number]

## 2010-12-21 ENCOUNTER — Other Ambulatory Visit: Payer: Self-pay | Admitting: *Deleted

## 2010-12-21 ENCOUNTER — Other Ambulatory Visit: Payer: PRIVATE HEALTH INSURANCE | Admitting: Lab

## 2011-03-30 ENCOUNTER — Encounter (HOSPITAL_COMMUNITY): Payer: Self-pay | Admitting: *Deleted

## 2011-03-30 ENCOUNTER — Emergency Department (INDEPENDENT_AMBULATORY_CARE_PROVIDER_SITE_OTHER)
Admission: EM | Admit: 2011-03-30 | Discharge: 2011-03-30 | Disposition: A | Discharge status: Home / Self Care | Payer: 59 | Source: Home / Self Care | Attending: Family Medicine | Admitting: Family Medicine

## 2011-03-30 DIAGNOSIS — J302 Other seasonal allergic rhinitis: Secondary | ICD-10-CM

## 2011-03-30 DIAGNOSIS — J309 Allergic rhinitis, unspecified: Secondary | ICD-10-CM

## 2011-03-30 MED ORDER — CETIRIZINE HCL 10 MG PO TABS: 10.0000 mg | ORAL_TABLET | Freq: Every day | ORAL | Status: DC

## 2011-03-30 MED ORDER — ALBUTEROL SULFATE HFA 108 (90 BASE) MCG/ACT IN AERS: 1.0000 | INHALATION_SPRAY | Freq: Four times a day (QID) | RESPIRATORY_TRACT | Status: DC | PRN

## 2011-03-30 MED ORDER — FLUTICASONE PROPIONATE 50 MCG/ACT NA SUSP: 2.0000 | Freq: Every day | NASAL | Status: DC

## 2011-03-30 NOTE — ED Notes (Signed)
pT  REPORTS  SYMPTOMS  OF  DRY  COUGH  /  SORETHROAT       SINUS  CONGESTION         X   1  WEEK        SHE    IS  IN NO  ACUTE  DISTRESS  SITTING  UPRIGHT O N THE  EXAM TABLE  IN NO  SEVERE  DISTRESS  SPEAKING IN  COMPLETE          SENTANCES

## 2011-03-30 NOTE — ED Provider Notes (Signed)
History     CSN: [id number]  Arrival date & time 03/30/11  1340   First MD Initiated Contact with Patient 03/30/11 1604      Chief Complaint  Patient presents with  . Cough    (Consider location/radiation/quality/duration/timing/severity/associated sxs/prior treatment) Patient is a 31 y.o. female presenting with cough. The history is provided by the patient.  Cough This is a new problem. The current episode started more than 1 week ago. The problem occurs constantly. The problem has not changed since onset.The cough is non-productive. There has been no fever. Associated symptoms include rhinorrhea and sore throat. Pertinent negatives include no shortness of breath and no wheezing. She is not a smoker. Her past medical history is significant for asthma.    Past Medical History  Diagnosis Date  . Asthma   . Allergic rhinitis   . Interstitial cystitis   . Migraine   . Anemia     Past Surgical History  Procedure Date  . None     Family History  Problem Relation Age of Onset  . Hypertension Father   . Stroke Father   . Hyperlipidemia Father   . Cancer Brother     metastatic renal cell, died age 45  . Cancer Maternal Grandmother     breast  . Cancer Paternal Grandmother     breast    History  Substance Use Topics  . Smoking status: Former Smoker -- 0.3 packs/day for 10 years    Types: Cigarettes    Quit date: 02/03/2008  . Smokeless tobacco: Never Used  . Alcohol Use: No    OB History    Grav Para Term Preterm Abortions TAB SAB Ect Mult Living                  Review of Systems  Constitutional: Negative.   HENT: Positive for congestion, sore throat, rhinorrhea and postnasal drip.   Respiratory: Positive for cough. Negative for shortness of breath and wheezing.   Gastrointestinal: Negative.   Skin: Negative.     Allergies  Cephalosporins; Pyridium; Sulfa antibiotics; and Tramadol  Home Medications   Current Outpatient Rx  Name Route Sig Dispense  Refill  . ALBUTEROL SULFATE HFA 108 (90 BASE) MCG/ACT IN AERS Inhalation Inhale 1-2 puffs into the lungs every 6 (six) hours as needed for wheezing. 1 Inhaler 0  . ALPRAZOLAM 0.5 MG PO TABS Oral Take 0.5 mg by mouth at bedtime as needed.      Marland Kitchen AMITRIPTYLINE HCL 25 MG PO TABS Oral Take 25 mg by mouth at bedtime.      Marland Kitchen CETIRIZINE HCL 10 MG PO TABS Oral Take 1 tablet (10 mg total) by mouth daily. One tab daily for allergies 30 tablet 1  . CYCLOBENZAPRINE HCL 10 MG PO TABS Oral Take 10 mg by mouth at bedtime as needed.      Marland Kitchen FLUTICASONE PROPIONATE 50 MCG/ACT NA SUSP Nasal Place 2 sprays into the nose daily. 1 g 2  . HYDROCODONE-ACETAMINOPHEN 10-325 MG PO TABS Oral Take 1 tablet by mouth every 4 (four) hours as needed.      Marland Kitchen HYDROXYZINE HCL 25 MG PO TABS Oral Take 25 mg by mouth at bedtime.      . BCG BLADDER INSTILLATION Bladder Instillation Instill into the bladder daily as needed. Heparin 40,000 units Lidocaine 2% 10cc NAHCO3 8.4% 5 CC mix for daily bladder instillation       BP 129/88  Pulse 98  Temp(Src) 98.4 F (36.9 C) (  Oral)  Resp 16  SpO2 100%  LMP 03/16/2011  Physical Exam  Nursing note and vitals reviewed. Constitutional: She appears well-developed and well-nourished.  HENT:  Head: Normocephalic.  Right Ear: External ear normal.  Left Ear: External ear normal.  Mouth/Throat: Oropharynx is clear and moist.  Eyes: Pupils are equal, round, and reactive to light.  Neck: Normal range of motion. Neck supple.  Cardiovascular: Normal rate, normal heart sounds and intact distal pulses.   Pulmonary/Chest: Effort normal and breath sounds normal.  Lymphadenopathy:    She has no cervical adenopathy.  Skin: Skin is warm and dry.    ED Course  Procedures (including critical care time)  Labs Reviewed - No data to display No results found.   1. Seasonal allergic rhinitis       MDM          Linna Hoff, MD 03/30/11 1635

## 2011-10-16 IMAGING — CT CT ANGIO CHEST
2 of 6 series · 19 of 36 positions shown · IV contrast (agent unspecified)
Comparison: None.

CLINICAL DATA: Shortness of breath.

CT ANGIOGRAPHY CHEST WITH CONTRAST
TECHNIQUE: Multidetector CT imaging of the chest was performed
using the standard protocol during bolus administration of
intravenous contrast.  Multiplanar CT image reconstructions
including MIPs were obtained to evaluate the vascular anatomy.
Contrast:  100 ml [id number]-ZLL.

[Series 8: pe thins @ 1mm · axial · 0.68mm/px · z∈[-293,-12]mm · 18 of 313 slices shown]
[im 16/313  lung]
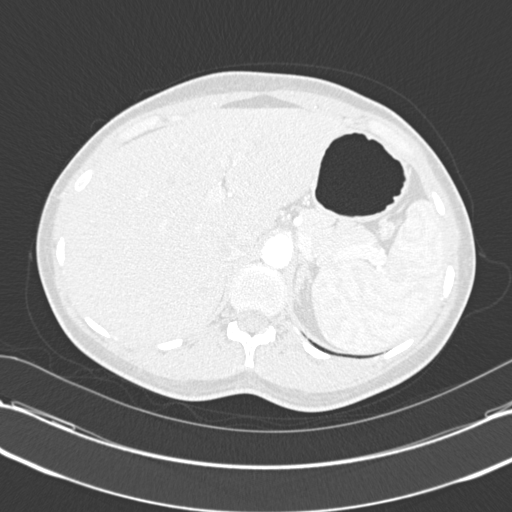
[im 32/313  mediastinal]
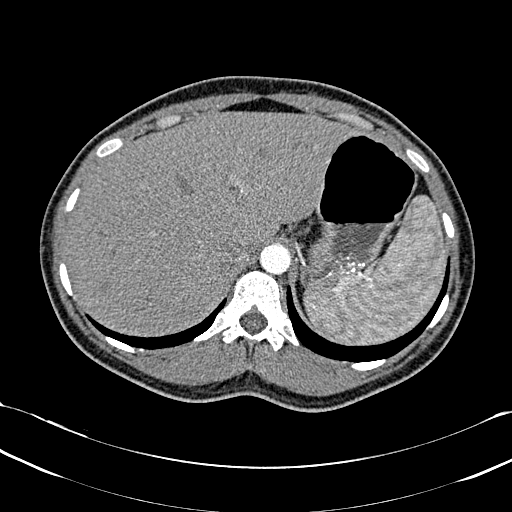
[im 47/313  lung]
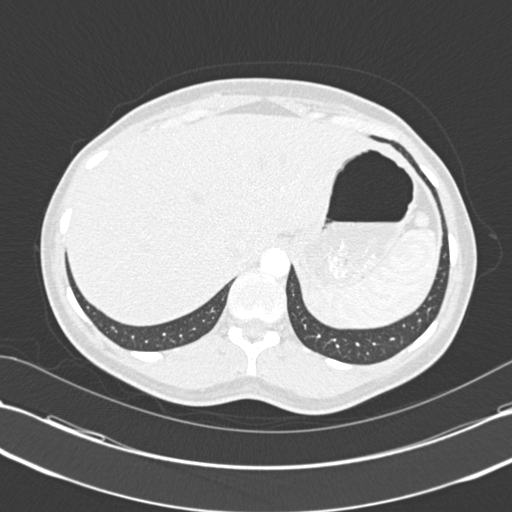
[im 63/313  mediastinal]
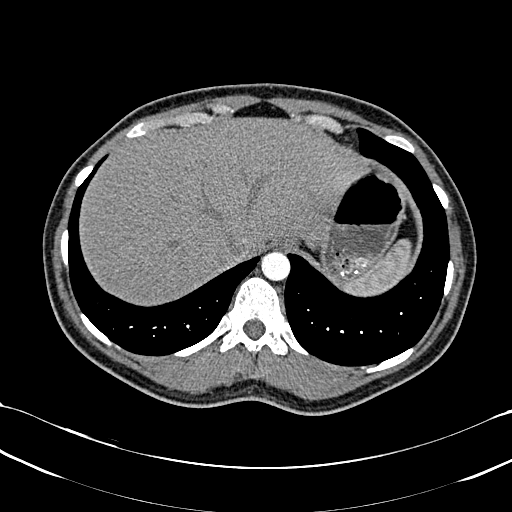
[im 79/313  lung]
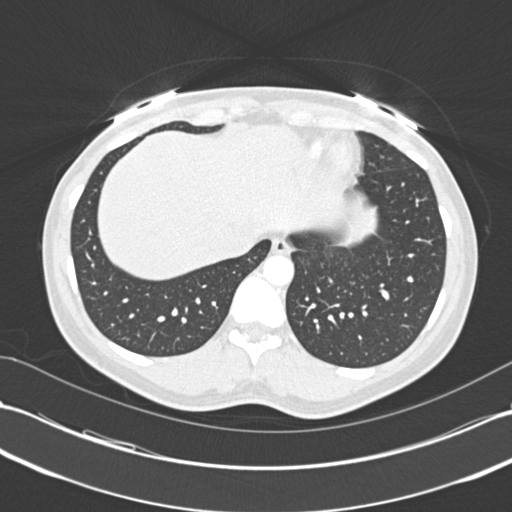
[im 94/313  mediastinal]
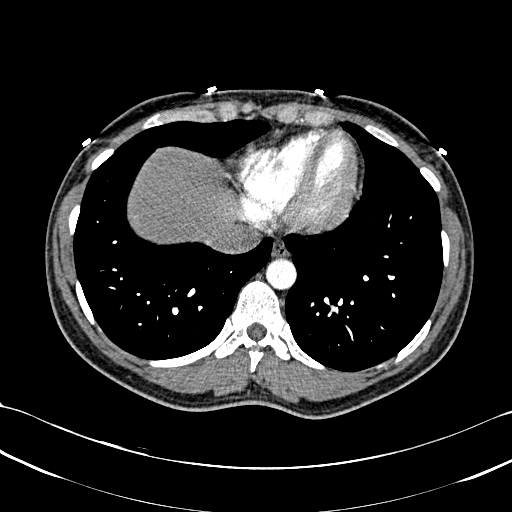
[im 110/313  lung]
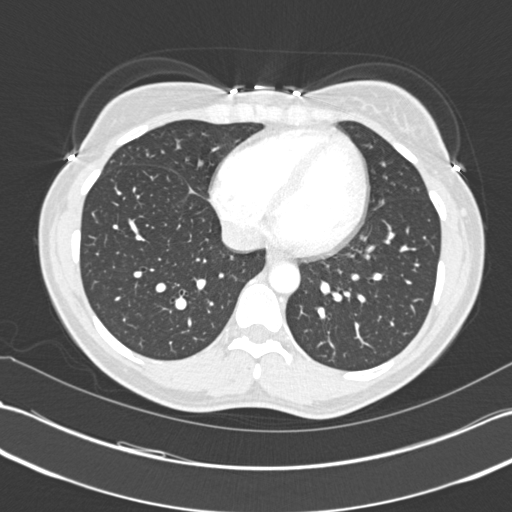
[im 125/313  mediastinal]
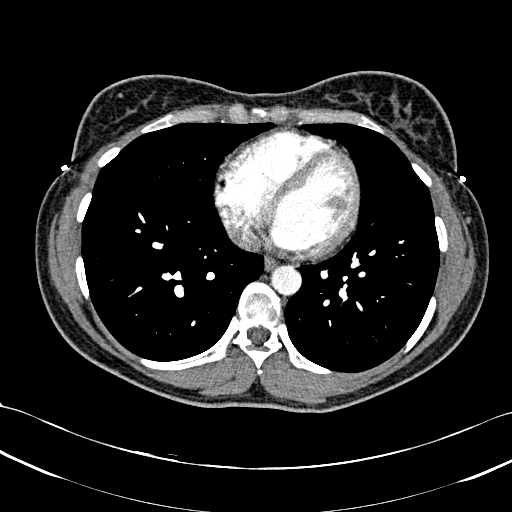
[im 141/313  lung]
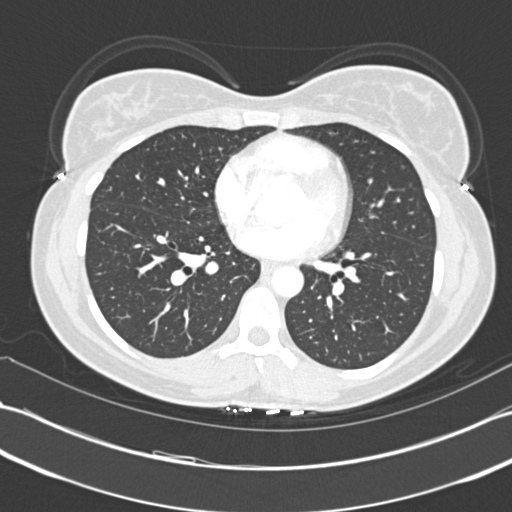
[im 172/313  mediastinal]
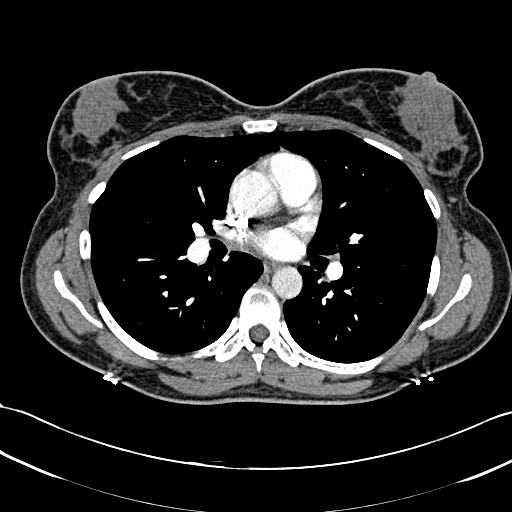
[im 188/313  lung]
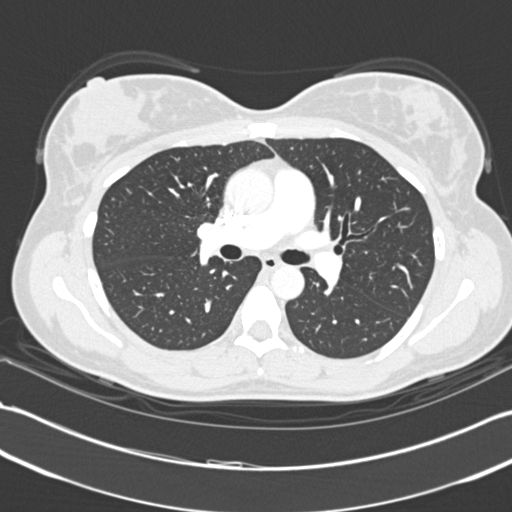
[im 203/313  mediastinal]
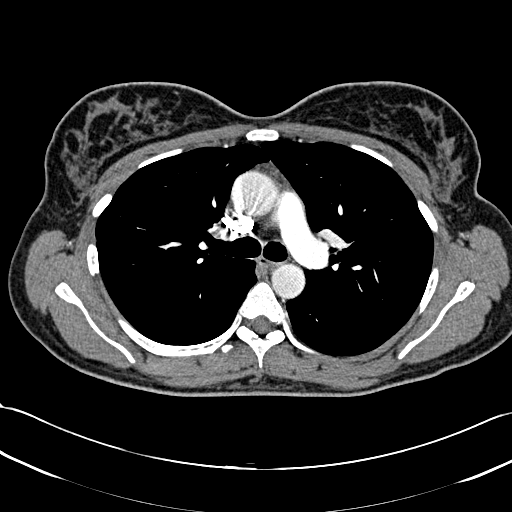
[im 219/313  lung]
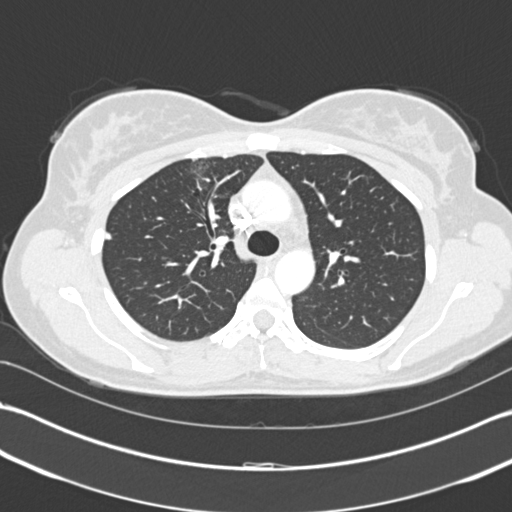
[im 235/313  mediastinal]
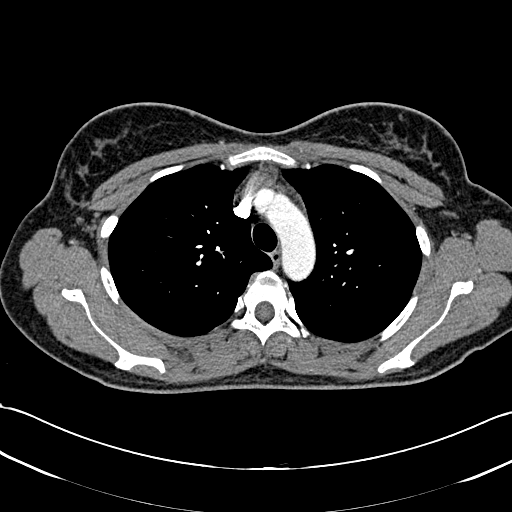
[im 250/313  lung]
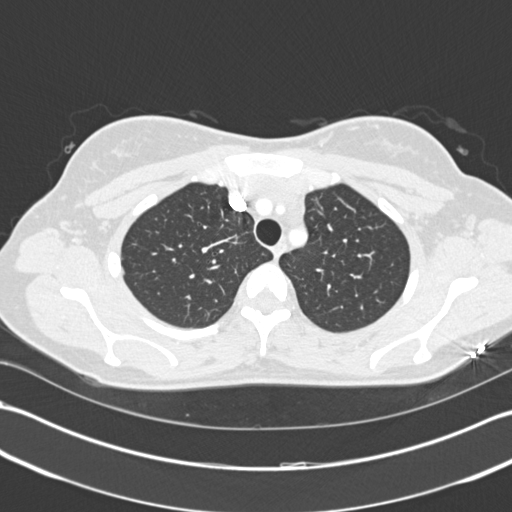
[im 266/313  mediastinal]
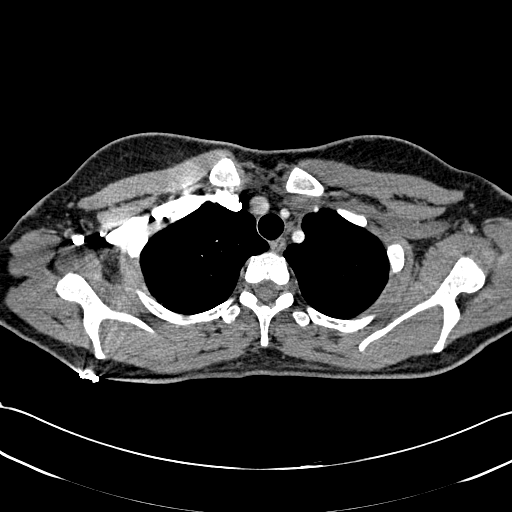
[im 281/313  lung]
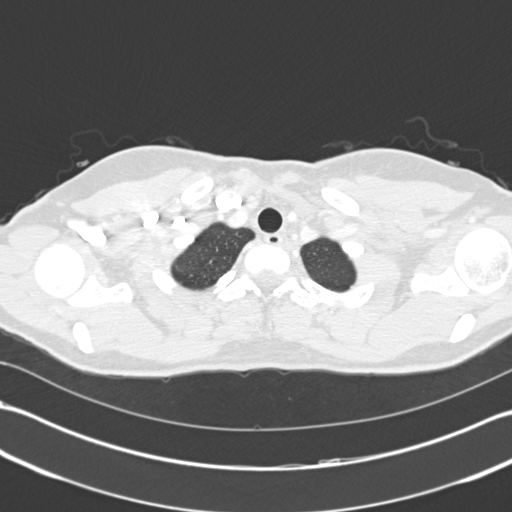
[im 297/313  mediastinal]
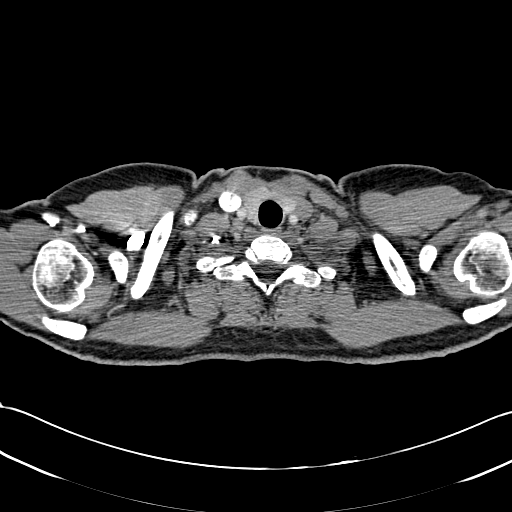

[Series 602: <mpr thick range> · coronal · 0.68mm/px · 1 of 99 slices shown]
[im 50/99  mediastinal]
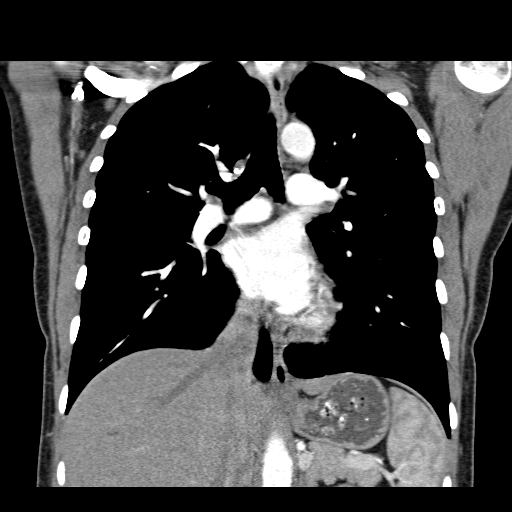

[19 of 36 positions shown; findings below may reference images not displayed]

FINDINGS: The chest wall is unremarkable.  No breast masses,
supraclavicular or axillary lymphadenopathy.  The thyroid gland
appears normal.  The bony thorax is intact.  No destructive bony
lesions or spinal canal compromise.

The heart is normal in size.  No pericardial effusion.  No
mediastinal or hilar lymphadenopathy.  The esophagus is grossly
normal.  The aorta is normal in caliber.  No dissection.

The pulmonary arterial tree is well opacified.  No filling defects
to suggest pulmonary emboli.

Examination of the lung parenchyma demonstrates no acute pulmonary
findings.  No pulmonary edema, pleural effusions or focal
infiltrates.  The tracheobronchial tree appears normal.  There is a
small nodule in the right upper lobe on image number 33.  This
measures 5 mm.  This has calcification and is consistent with a
benign granuloma.

The upper abdomen is unremarkable.

Review of the MIP images confirms the above findings.
IMPRESSION: 1.  No CT findings for pulmonary embolism.
2.  Normal thoracic aorta.
3.  No acute pulmonary findings.

## 2011-11-07 IMAGING — US US ABDOMEN LIMITED
1 series · 14 of 17 positions shown · non-contrast
Comparison: None.

CLINICAL DATA: Splenomegaly on exam.

LIMITED ABDOMINAL ULTRASOUND

[Series 1: us abdomen limited · 0.28mm/px · 14 of 17 slices shown]
[im 1/17]
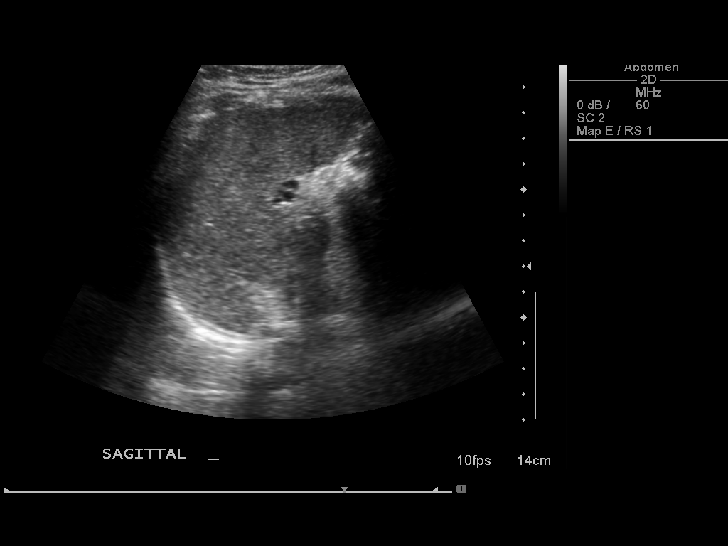
[im 2/17]
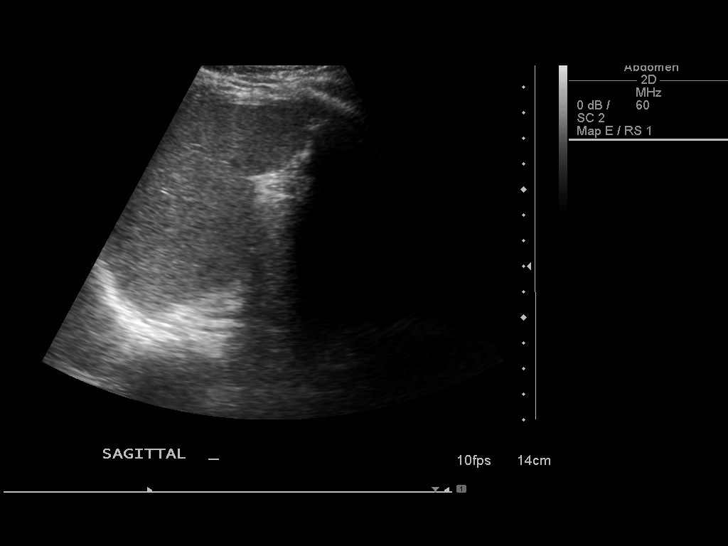
[im 4/17]
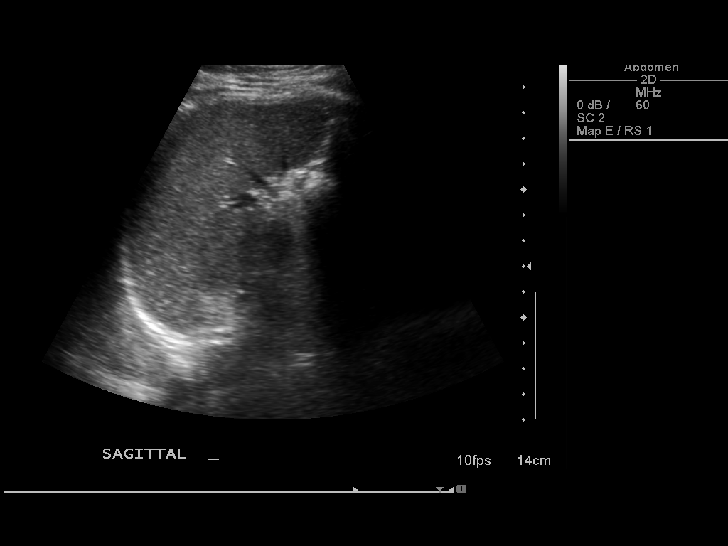
[im 5/17]
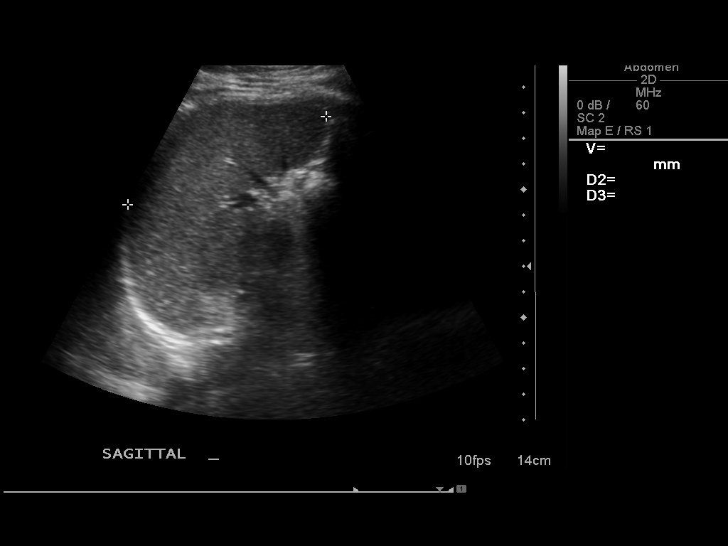
[im 6/17]
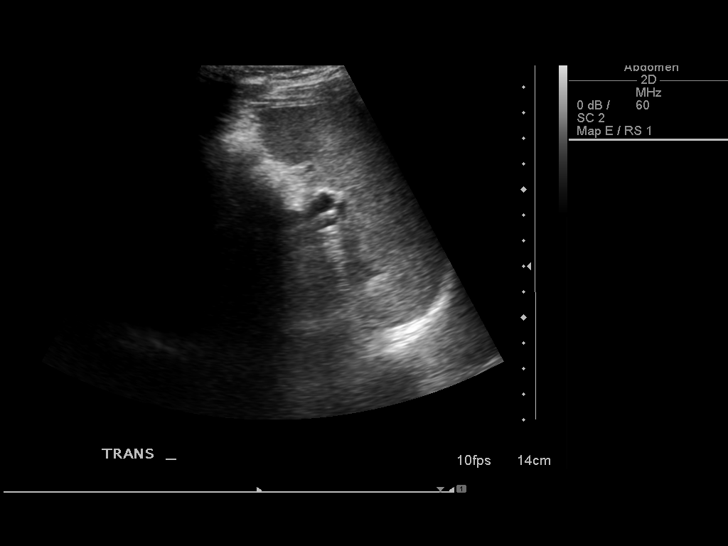
[im 7/17]
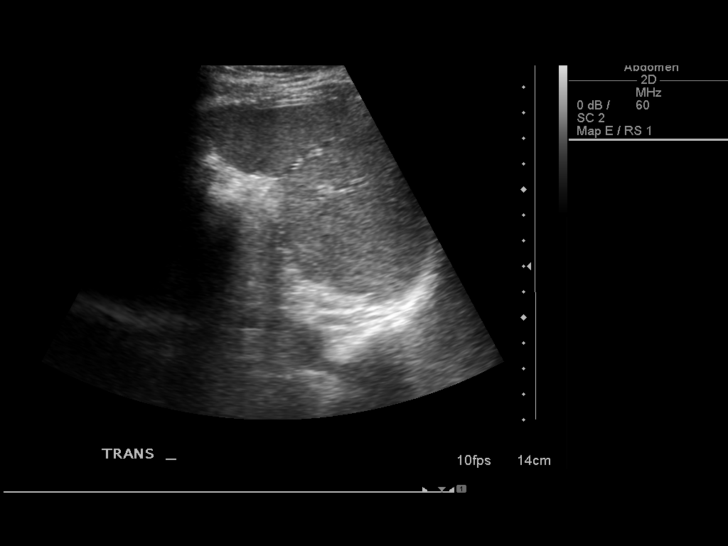
[im 8/17]
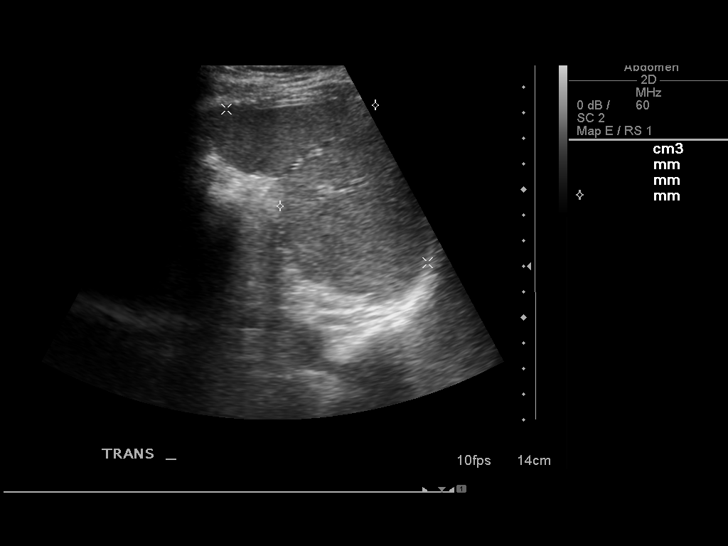
[im 10/17]
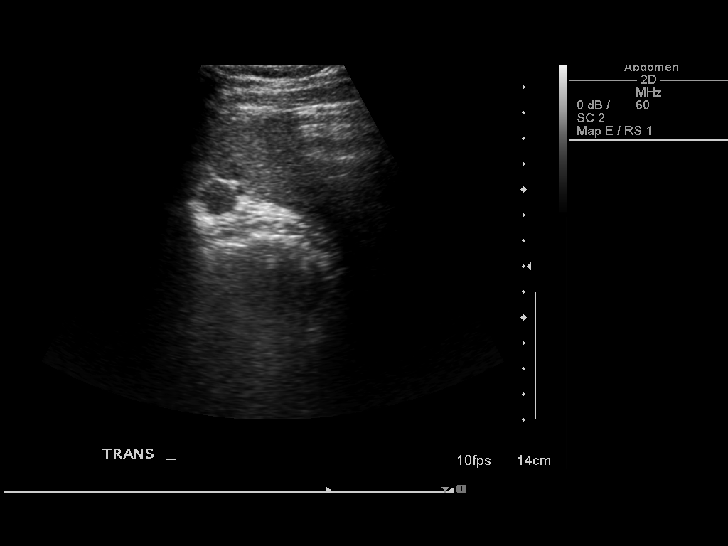
[im 11/17]
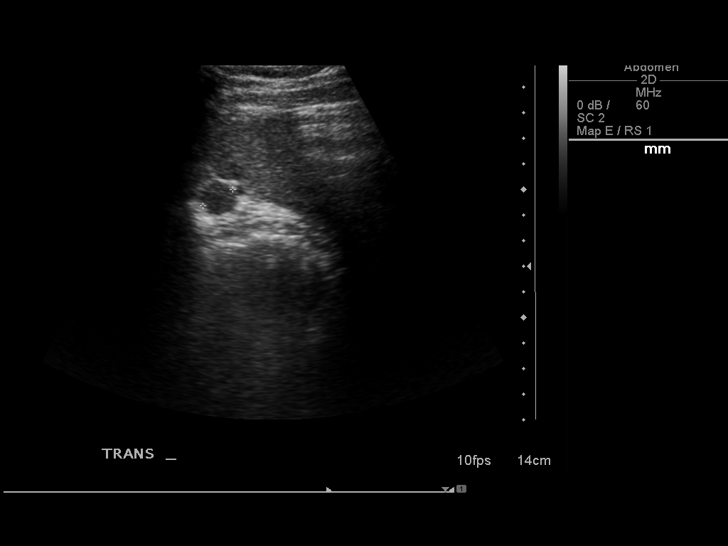
[im 12/17]
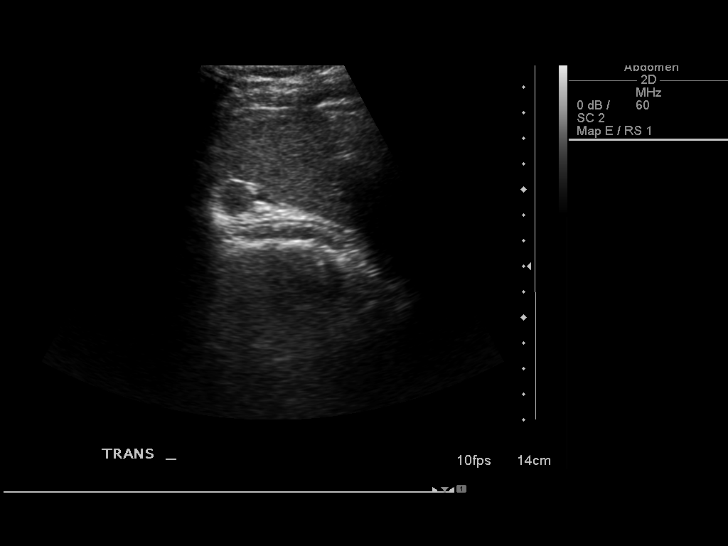
[im 13/17]
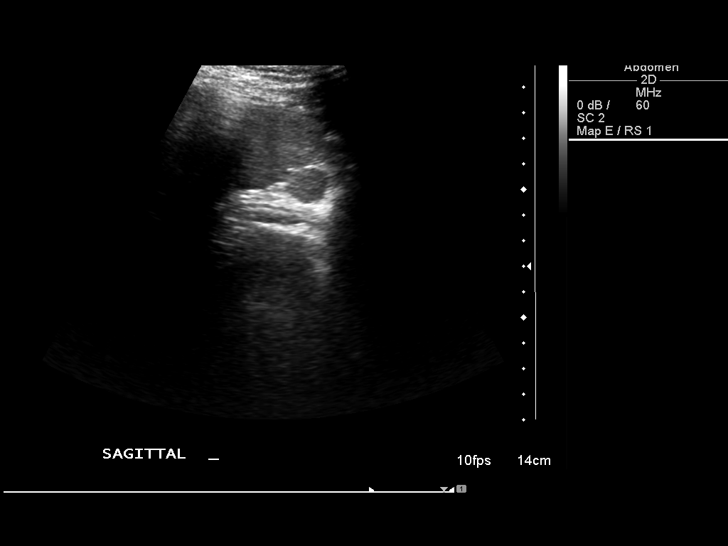
[im 14/17]
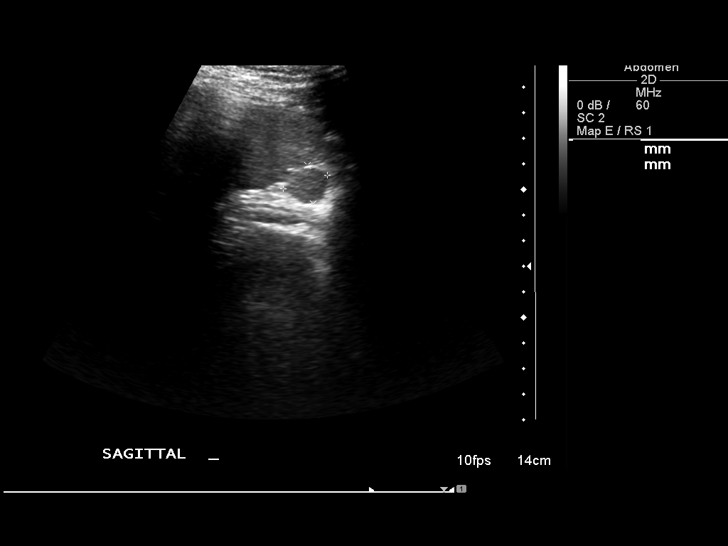
[im 16/17]
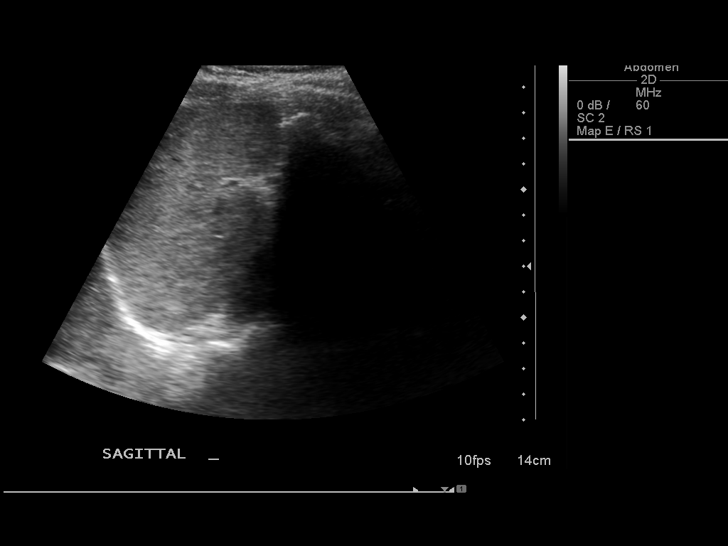
[im 17/17]
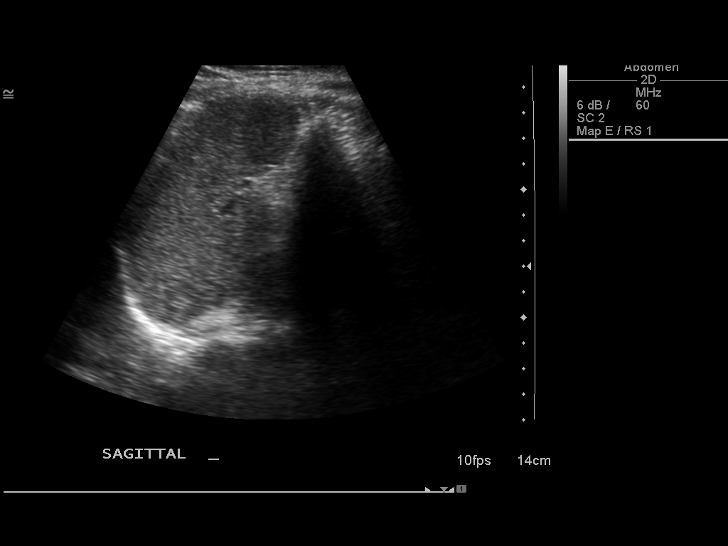

[14 of 17 positions shown; findings below may reference images not displayed]

FINDINGS: Limited abdominal ultrasound examination was performed to
evaluate the spleen.  The spleen has a normal size, measuring
cm in length, and have an estimated volume of 240 ml.  Normal
parenchymal echogenicity is seen and no splenic lesions are
identified.  No evidence of perisplenic fluid.
IMPRESSION: Normal size and sonographic appearance of the spleen.  No evidence
of splenomegaly.

## 2019-01-07 ENCOUNTER — Ambulatory Visit: Payer: 59 | Attending: Internal Medicine

## 2019-01-07 DIAGNOSIS — Z20822 Contact with and (suspected) exposure to covid-19: Secondary | ICD-10-CM

## 2019-01-09 LAB — NOVEL CORONAVIRUS, NAA: SARS-CoV-2, NAA: NOT DETECTED

## 2020-12-23 LAB — OB RESULTS CONSOLE GC/CHLAMYDIA
Chlamydia: NEGATIVE
Neisseria Gonorrhea: NEGATIVE

## 2021-01-02 NOTE — L&D Delivery Note (Addendum)
Delivery Note She progressed to complete and pushed for about an hour.  At 10:11 PM a viable female was delivered via Vaginal, Spontaneous (Presentation:  vtx, LOA).  APGAR: 7, 8; weight pending.   Placenta status: Spontaneous;Expressed, Intact.  Cord:   with the following complications:  none.   Anesthesia: Epidural Episiotomy: None Lacerations:  2nd degree Suture Repair: 3.0 vicryl Est. Blood Loss (mL):  220  Mom to postpartum.  Baby to Couplet care / Skin to Skin.  Will continue Procardia XL 60 mg daily for Suzanne Mendoza 08/03/2021, 10:44 PM

## 2021-01-18 LAB — HEPATITIS C ANTIBODY: HCV Ab: NEGATIVE

## 2021-01-18 LAB — OB RESULTS CONSOLE ABO/RH: RH Type: NEGATIVE

## 2021-01-18 LAB — OB RESULTS CONSOLE HIV ANTIBODY (ROUTINE TESTING): HIV: NONREACTIVE

## 2021-01-18 LAB — OB RESULTS CONSOLE RPR: RPR: NONREACTIVE

## 2021-01-18 LAB — OB RESULTS CONSOLE ANTIBODY SCREEN: Antibody Screen: NEGATIVE

## 2021-01-18 LAB — OB RESULTS CONSOLE RUBELLA ANTIBODY, IGM: Rubella: IMMUNE

## 2021-01-18 LAB — OB RESULTS CONSOLE HEPATITIS B SURFACE ANTIGEN: Hepatitis B Surface Ag: NEGATIVE

## 2021-02-10 ENCOUNTER — Encounter: Payer: Self-pay | Admitting: Cardiology

## 2021-02-10 ENCOUNTER — Other Ambulatory Visit: Payer: Self-pay

## 2021-02-10 ENCOUNTER — Ambulatory Visit: Payer: BC Managed Care – PPO | Admitting: Cardiology

## 2021-02-10 VITALS — BP 124/80 | HR 77 | Ht 63.5 in | Wt 149.4 lb

## 2021-02-10 DIAGNOSIS — O10919 Unspecified pre-existing hypertension complicating pregnancy, unspecified trimester: Secondary | ICD-10-CM

## 2021-02-10 DIAGNOSIS — Z719 Counseling, unspecified: Secondary | ICD-10-CM | POA: Diagnosis not present

## 2021-02-10 MED ORDER — ASPIRIN EC 81 MG PO TBEC: 81.0000 mg | DELAYED_RELEASE_TABLET | Freq: Every day | ORAL | 3 refills | Status: DC

## 2021-02-10 NOTE — Patient Instructions (Signed)
Medication Instructions:  Your physician has recommended you make the following change in your medication:  START: Aspirin 81 mg once daily *If you need a refill on your cardiac medications before your next appointment, please call your pharmacy*   Lab Work: None If you have labs (blood work) drawn today and your tests are completely normal, you will receive your results only by: MyChart Message (if you have MyChart) OR A paper copy in the mail If you have any lab test that is abnormal or we need to change your treatment, we will call you to review the results.   Testing/Procedures: None   Follow-Up: At [facility], you and your health needs are our priority.  As part of our continuing mission to provide you with exceptional heart care, we have created designated Provider Care Teams.  These Care Teams include your primary Cardiologist (physician) and Advanced Practice Providers (APPs -  Physician Assistants and Nurse Practitioners) who all work together to provide you with the care you need, when you need it.  We recommend signing up for the patient portal called "MyChart".  Sign up information is provided on this After Visit Summary.  MyChart is used to connect with patients for Virtual Visits (Telemedicine).  Patients are able to view lab/test results, encounter notes, upcoming appointments, etc.  Non-urgent messages can be sent to your provider as well.   To learn more about what you can do with MyChart, go to [email or web address].    Your next appointment:   12 week(s)  The format for your next appointment:   In Person  Provider:   Thomasene Ripple  [facility] [address] [place], [place] [address]     Other Instructions

## 2021-02-10 NOTE — Progress Notes (Signed)
Cardio-Obstetrics Clinic  New Evaluation  Date:  02/10/2021   ID:  RIGBY Suzanne Mendoza, DOB August 03, 1980, MRN [id number]  PCP:  System, Provider Not In   [facility] Providers Cardiologist:  Thomasene Ripple, DO  Electrophysiologist:  None       Referring MD: Charlett Nose, MD   Chief Complaint: "I am doing well"  History of Present Illness:    Suzanne Mendoza is a 41 y.o. female [G1P0] who is being seen today for the evaluation of chronic hypertension in pregnancy at the request of Charlett Nose, MD.   History of chronic hypertension , chronic interstitial cystitis she was diagnosed in September 2022 is here today after being referred by her OB/GYN for chronic hypertension in pregnancy.  She offers no complaints today.  She denies any chest pain or shortness of breath.  She has been titrated up on her Procardia and now takes 60 mg daily.  Prior CV Studies Reviewed: The following studies were reviewed today:   Past Medical History:  Diagnosis Date   Allergic rhinitis    Anemia    Asthma    Interstitial cystitis    Migraine     Past Surgical History:  Procedure Laterality Date   none        OB History     Gravida  1   Para      Term      Preterm      AB      Living         SAB      IAB      Ectopic      Multiple      Live Births                  Current Medications: Current Meds  Medication Sig   aspirin EC 81 MG tablet Take 1 tablet (81 mg total) by mouth daily. Swallow whole.   cyclobenzaprine (FLEXERIL) 10 MG tablet Take 10 mg by mouth at bedtime as needed.     NIFEdipine (PROCARDIA XL/NIFEDICAL XL) 60 MG 24 hr tablet Take 60 mg by mouth daily.   Prenatal Vit-Fe Fumarate-FA (PRENATAL PO) Take by mouth daily.   promethazine (PHENERGAN) 25 MG tablet Take 25 mg by mouth every 6 (six) hours as needed for nausea or vomiting.   Vitamin D, Ergocalciferol, (DRISDOL) 1.25 MG (50000 UNIT) CAPS capsule Take 50,000 Units by mouth  once a week.     Allergies:   Cephalosporins, Pyridium [phenazopyridine hcl], Sulfa antibiotics, and Tramadol   Social History   Socioeconomic History   Marital status: Single    Spouse name: Not on file   Number of children: Not on file   Years of education: Not on file   Highest education level: Not on file  Occupational History   Not on file  Tobacco Use   Smoking status: Former    Packs/day: 0.30    Years: 10.00    Pack years: 3.00    Types: Cigarettes    Quit date: 02/03/2008    Years since quitting: 13.0   Smokeless tobacco: Never  Substance and Sexual Activity   Alcohol use: No   Drug use: Not on file   Sexual activity: Yes    Birth control/protection: None  Other Topics Concern   Not on file  Social History Narrative   Not on file   Social Determinants of Health   Financial Resource Strain: Not on file  Food Insecurity:  No Food Insecurity   Worried About Programme researcher, broadcasting/film/video in the Last Year: Never true   Ran Out of Food in the Last Year: Never true  Transportation Needs: No Transportation Needs   Lack of Transportation (Medical): No   Lack of Transportation (Non-Medical): No  Physical Activity: Not on file  Stress: Not on file  Social Connections: Not on file      Family History  Problem Relation Age of Onset   Hypertension Father    Stroke Father    Hyperlipidemia Father    Cancer Brother        metastatic renal cell, died age 68   Cancer Maternal Grandmother        breast   Cancer Paternal Grandmother        breast      ROS:   Please see the history of present illness.     All other systems reviewed and are negative.   Labs/EKG Reviewed:    EKG:   EKG is was  ordered today.  The ekg ordered today demonstrates   Recent Labs: No results found for requested labs within last 8760 hours.   Recent Lipid Panel No results found for: CHOL, TRIG, HDL, CHOLHDL, LDLCALC, LDLDIRECT  Physical Exam:    VS:  BP 124/80    Pulse 77    Ht 5' 3.5"  (1.613 m)    Wt 149 lb 6.4 oz (67.8 kg)    LMP 11/05/2020    SpO2 97%    BMI 26.05 kg/m     Wt Readings from Last 3 Encounters:  02/10/21 149 lb 6.4 oz (67.8 kg)  11/21/10 140 lb 8 oz (63.7 kg)  09/23/10 142 lb 3.2 oz (64.5 kg)     GEN:  Well nourished, well developed in no acute distress HEENT: Normal NECK: No JVD; No carotid bruits LYMPHATICS: No lymphadenopathy CARDIAC: RRR, no murmurs, rubs, gallops RESPIRATORY:  Clear to auscultation without rales, wheezing or rhonchi  ABDOMEN: Soft, non-tender, non-distended MUSCULOSKELETAL:  No edema; No deformity  SKIN: Warm and dry NEUROLOGIC:  Alert and oriented x 3 PSYCHIATRIC:  Normal affect    Risk Assessment/Risk Calculators:     CARPREG II Risk Prediction Index Score:  1.  The patient's risk for a primary cardiac event is 5%.            ASSESSMENT & PLAN:    Chronic hypertension in pregnancy Advanced maternal age Health education  Her blood pressure is at the point the office today.  She is on Procardia 60 mg daily.  We discussed with the patient about the importance of taking her blood pressure medication as well as her blood pressure.  She expresses understanding. We will start the patient on aspirin 81 mg daily for her risk for preeclampsia.  I did educate her what this medication is necessary.  All of her questions has been answered   Patient Instructions  Medication Instructions:  Your physician has recommended you make the following change in your medication:  START: Aspirin 81 mg once daily *If you need a refill on your cardiac medications before your next appointment, please call your pharmacy*   Lab Work: None If you have labs (blood work) drawn today and your tests are completely normal, you will receive your results only by: MyChart Message (if you have MyChart) OR A paper copy in the mail If you have any lab test that is abnormal or we need to change your treatment, we will call  you to review the  results.   Testing/Procedures: None   Follow-Up: At [facility], you and your health needs are our priority.  As part of our continuing mission to provide you with exceptional heart care, we have created designated Provider Care Teams.  These Care Teams include your primary Cardiologist (physician) and Advanced Practice Providers (APPs -  Physician Assistants and Nurse Practitioners) who all work together to provide you with the care you need, when you need it.  We recommend signing up for the patient portal called "MyChart".  Sign up information is provided on this After Visit Summary.  MyChart is used to connect with patients for Virtual Visits (Telemedicine).  Patients are able to view lab/test results, encounter notes, upcoming appointments, etc.  Non-urgent messages can be sent to your provider as well.   To learn more about what you can do with MyChart, go to [email or web address].    Your next appointment:   12 week(s)  The format for your next appointment:   In Person  Provider:   Thomasene Ripple  [facility] [address], [place], [place] [address]     Other Instructions     Dispo:  No follow-ups on file.   Medication Adjustments/Labs and Tests Ordered: Current medicines are reviewed at length with the patient today.  Concerns regarding medicines are outlined above.  Tests Ordered: Orders Placed This Encounter  Procedures   EKG 12-Lead   Medication Changes: Meds ordered this encounter  Medications   aspirin EC 81 MG tablet    Sig: Take 1 tablet (81 mg total) by mouth daily. Swallow whole.    Dispense:  90 tablet    Refill:  3

## 2021-05-06 ENCOUNTER — Encounter: Payer: Self-pay | Admitting: Cardiology

## 2021-05-06 ENCOUNTER — Ambulatory Visit (INDEPENDENT_AMBULATORY_CARE_PROVIDER_SITE_OTHER): Payer: BC Managed Care – PPO | Admitting: Cardiology

## 2021-05-06 VITALS — BP 122/88 | HR 93 | Ht 63.5 in | Wt 152.2 lb

## 2021-05-06 DIAGNOSIS — O10919 Unspecified pre-existing hypertension complicating pregnancy, unspecified trimester: Secondary | ICD-10-CM | POA: Diagnosis not present

## 2021-05-06 DIAGNOSIS — Z719 Counseling, unspecified: Secondary | ICD-10-CM

## 2021-05-06 NOTE — Progress Notes (Signed)
?Cardio-Obstetrics Clinic ? ?Follow Up Note ? ? ?Date:  05/06/2021  ? ?ID:  Suzanne Mendoza, DOB 1980-11-07, MRN [id number] ? ?PCP:  System, Provider Not In ?  ?[facility] Providers ?Cardiologist:  Thomasene Ripple, DO  ?Electrophysiologist:  None      ? ? ?Referring MD: No ref. provider found  ? ?Chief Complaint: " I am doing much better" ? ? ?History of Present Illness:   ? ?Suzanne Mendoza is a 41 y.o. female [G1P0] who returns for follow up of chronic hypertension in pregnancy. ? ?Medical history includes chronic hypertension, chronic interstitial cystitis. ? ?I initially saw the patient in February 10, 2021 at that time at that time she was on nifedipine 60 mg and her blood pressure was at target therefore no change in medication was made. ?I added aspirin 81 mg to her regimen that day. ? ?Since I saw the patient she tells me she has been doing well from a cardiovascular standpoint.  Her blood pressure has been within target.  She offers no complaints. ? ? ?Prior CV Studies Reviewed: ?The following studies were reviewed today: ? ? ?Past Medical History:  ?Diagnosis Date  ? Allergic rhinitis   ? Anemia   ? Asthma   ? Interstitial cystitis   ? Migraine   ? ? ?Past Surgical History:  ?Procedure Laterality Date  ? none    ?   ? ?OB History   ? ? Gravida  ?1  ? Para  ?   ? Term  ?   ? Preterm  ?   ? AB  ?   ? Living  ?   ?  ? ? SAB  ?   ? IAB  ?   ? Ectopic  ?   ? Multiple  ?   ? Live Births  ?   ?   ?  ?  ?    ? ? ?Current Medications: ?Current Meds  ?Medication Sig  ? aspirin EC 81 MG tablet Take 1 tablet (81 mg total) by mouth daily. Swallow whole.  ? cyclobenzaprine (FLEXERIL) 10 MG tablet Take 10 mg by mouth at bedtime as needed.    ? famotidine (PEPCID) 20 MG tablet 2 (two) times daily.  ? NIFEdipine (PROCARDIA XL/NIFEDICAL XL) 60 MG 24 hr tablet Take 60 mg by mouth daily.  ? Prenatal Vit-Fe Fumarate-FA (PRENATAL PO) Take by mouth daily.  ? promethazine (PHENERGAN) 25 MG tablet Take 25 mg by mouth every 6  (six) hours as needed for nausea or vomiting.  ?  ? ?Allergies:   Cephalosporins, Pyridium [phenazopyridine hcl], Sulfa antibiotics, and Tramadol  ? ?Social History  ? ?Socioeconomic History  ? Marital status: Single  ?  Spouse name: Not on file  ? Number of children: Not on file  ? Years of education: Not on file  ? Highest education level: Not on file  ?Occupational History  ? Not on file  ?Tobacco Use  ? Smoking status: Former  ?  Packs/day: 0.30  ?  Years: 10.00  ?  Pack years: 3.00  ?  Types: Cigarettes  ?  Quit date: 02/03/2008  ?  Years since quitting: 13.2  ? Smokeless tobacco: Never  ?Substance and Sexual Activity  ? Alcohol use: No  ? Drug use: Not on file  ? Sexual activity: Yes  ?  Birth control/protection: None  ?Other Topics Concern  ? Not on file  ?Social History Narrative  ? Not on file  ? ?Social Determinants of  Health  ? ?Financial Resource Strain: Not on file  ?Food Insecurity: No Food Insecurity  ? Worried About Charity fundraiser in the Last Year: Never true  ? Ran Out of Food in the Last Year: Never true  ?Transportation Needs: No Transportation Needs  ? Lack of Transportation (Medical): No  ? Lack of Transportation (Non-Medical): No  ?Physical Activity: Not on file  ?Stress: Not on file  ?Social Connections: Not on file  ?  ? ? ?Family History  ?Problem Relation Age of Onset  ? Hypertension Father   ? Stroke Father   ? Hyperlipidemia Father   ? Cancer Brother   ?     metastatic renal cell, died age 13  ? Cancer Maternal Grandmother   ?     breast  ? Cancer Paternal Grandmother   ?     breast  ?   ? ?ROS:   ?Please see the history of present illness.    ? ?All other systems reviewed and are negative. ? ? ?Labs/EKG Reviewed:   ? ?EKG:   ?EKG is was not ordered today.   ? ?Recent Labs: ?No results found for requested labs within last 8760 hours.  ? ?Recent Lipid Panel ?No results found for: CHOL, TRIG, HDL, CHOLHDL, LDLCALC, LDLDIRECT ? ?Physical Exam:   ? ?VS:  BP 122/88   Pulse 93   Ht 5' 3.5"  (1.613 m)   Wt 152 lb 3.2 oz (69 kg)   LMP 11/05/2020   SpO2 98%   BMI 26.54 kg/m?    ? ?Wt Readings from Last 3 Encounters:  ?05/06/21 152 lb 3.2 oz (69 kg)  ?02/10/21 149 lb 6.4 oz (67.8 kg)  ?11/21/10 140 lb 8 oz (63.7 kg)  ?  ? ?GEN:  Well nourished, well developed in no acute distress ?HEENT: Normal ?NECK: No JVD; No carotid bruits ?LYMPHATICS: No lymphadenopathy ?CARDIAC: RRR, no murmurs, rubs, gallops ?RESPIRATORY:  Clear to auscultation without rales, wheezing or rhonchi  ?ABDOMEN: Soft, non-tender, non-distended ?MUSCULOSKELETAL:  No edema; No deformity  ?SKIN: Warm and dry ?NEUROLOGIC:  Alert and oriented x 3 ?PSYCHIATRIC:  Normal affect  ? ? ?Risk Assessment/Risk Calculators:   ?  ?CARPREG II ?Risk Prediction Index Score:  1.  The patient's risk for a primary cardiac event is 5%. ?  ?   ?  ?  ? ? ?ASSESSMENT & PLAN:   ? ?Chronic hypertension pregnancy ? ?Blood pressure is within target today.  Continue her current medication regimen. ?Continue aspirin 81 mg for preeclampsia prophylaxis. ?We talked about what to expect moving onto the third trimester. ?I have also asked the patient to take her blood pressure daily. ? ? ? ?Patient Instructions  ?Medication Instructions:  ?Your physician recommends that you continue on your current medications as directed. Please refer to the Current Medication list given to you today.  ?*If you need a refill on your cardiac medications before your next appointment, please call your pharmacy* ? ? ?Lab Work: ?None ?If you have labs (blood work) drawn today and your tests are completely normal, you will receive your results only by: ?MyChart Message (if you have MyChart) OR ?A paper copy in the mail ?If you have any lab test that is abnormal or we need to change your treatment, we will call you to review the results. ? ? ?Testing/Procedures: ?None ? ? ?Follow-Up: ?At [facility], you and your health needs are our priority.  As part of our continuing mission to provide  you with exceptional heart care, we have created designated Provider Care Teams.  These Care Teams include your primary Cardiologist (physician) and Advanced Practice Providers (APPs -  Physician Assistants and Nurse Practitioners) who all work together to provide you with the care you need, when you need it. ? ?We recommend signing up for the patient portal called "MyChart".  Sign up information is provided on this After Visit Summary.  MyChart is used to connect with patients for Virtual Visits (Telemedicine).  Patients are able to view lab/test results, encounter notes, upcoming appointments, etc.  Non-urgent messages can be sent to your provider as well.   ?To learn more about what you can do with MyChart, go to [email or web address].   ? ?Your next appointment:   ?12 week(s) ? ?The format for your next appointment:   ?In Person ? ?Provider:   ?Berniece Salines, DO  ? ? ?Other Instructions ? ? ?Important Information About Sugar ? ? ? ? ?  ? ? ?Dispo:  Return in about 12 weeks (around 07/29/2021).  ? ?Medication Adjustments/Labs and Tests Ordered: ?Current medicines are reviewed at length with the patient today.  Concerns regarding medicines are outlined above.  ?Tests Ordered: ?No orders of the defined types were placed in this encounter. ? ?Medication Changes: ?No orders of the defined types were placed in this encounter. ? ?

## 2021-05-06 NOTE — Patient Instructions (Signed)
Medication Instructions:  ?Your physician recommends that you continue on your current medications as directed. Please refer to the Current Medication list given to you today.  ?*If you need a refill on your cardiac medications before your next appointment, please call your pharmacy* ? ? ?Lab Work: ?None ?If you have labs (blood work) drawn today and your tests are completely normal, you will receive your results only by: ?MyChart Message (if you have MyChart) OR ?A paper copy in the mail ?If you have any lab test that is abnormal or we need to change your treatment, we will call you to review the results. ? ? ?Testing/Procedures: ?None ? ? ?Follow-Up: ?At [facility], you and your health needs are our priority.  As part of our continuing mission to provide you with exceptional heart care, we have created designated Provider Care Teams.  These Care Teams include your primary Cardiologist (physician) and Advanced Practice Providers (APPs -  Physician Assistants and Nurse Practitioners) who all work together to provide you with the care you need, when you need it. ? ?We recommend signing up for the patient portal called "MyChart".  Sign up information is provided on this After Visit Summary.  MyChart is used to connect with patients for Virtual Visits (Telemedicine).  Patients are able to view lab/test results, encounter notes, upcoming appointments, etc.  Non-urgent messages can be sent to your provider as well.   ?To learn more about what you can do with MyChart, go to [email or web address].   ? ?Your next appointment:   ?12 week(s) ? ?The format for your next appointment:   ?In Person ? ?Provider:   ?Kardie Tobb, DO   ? ? ?Other Instructions ? ? ?Important Information About Sugar ? ? ? ? ?  ?

## 2021-07-14 ENCOUNTER — Ambulatory Visit (INDEPENDENT_AMBULATORY_CARE_PROVIDER_SITE_OTHER): Payer: Self-pay | Admitting: Pediatrics

## 2021-07-14 DIAGNOSIS — Z7681 Expectant parent(s) prebirth pediatrician visit: Secondary | ICD-10-CM

## 2021-07-14 NOTE — Progress Notes (Signed)
Prenatal counseling for impending newborn done-- Z76.81  

## 2021-07-20 LAB — OB RESULTS CONSOLE GBS: GBS: NEGATIVE

## 2021-07-26 ENCOUNTER — Telehealth (HOSPITAL_COMMUNITY): Payer: Self-pay | Admitting: *Deleted

## 2021-07-26 ENCOUNTER — Encounter (HOSPITAL_COMMUNITY): Payer: Self-pay | Admitting: *Deleted

## 2021-07-26 NOTE — Telephone Encounter (Signed)
Preadmission screen  

## 2021-07-27 ENCOUNTER — Encounter (HOSPITAL_COMMUNITY): Payer: Self-pay

## 2021-07-28 ENCOUNTER — Inpatient Hospital Stay (HOSPITAL_COMMUNITY): Payer: BC Managed Care – PPO

## 2021-07-28 ENCOUNTER — Telehealth (HOSPITAL_COMMUNITY): Payer: Self-pay | Admitting: *Deleted

## 2021-07-28 ENCOUNTER — Encounter (HOSPITAL_COMMUNITY): Payer: Self-pay | Admitting: *Deleted

## 2021-07-28 NOTE — Telephone Encounter (Signed)
Preadmission screen  

## 2021-07-29 ENCOUNTER — Encounter: Payer: Self-pay | Admitting: Cardiology

## 2021-07-29 ENCOUNTER — Telehealth (INDEPENDENT_AMBULATORY_CARE_PROVIDER_SITE_OTHER): Payer: BC Managed Care – PPO | Admitting: Cardiology

## 2021-07-29 VITALS — Ht 63.5 in | Wt 156.0 lb

## 2021-07-29 DIAGNOSIS — Z719 Counseling, unspecified: Secondary | ICD-10-CM | POA: Diagnosis not present

## 2021-07-29 DIAGNOSIS — O10919 Unspecified pre-existing hypertension complicating pregnancy, unspecified trimester: Secondary | ICD-10-CM

## 2021-07-29 NOTE — Patient Instructions (Signed)
Medication Instructions:  Your physician recommends that you continue on your current medications as directed. Please refer to the Current Medication list given to you today.  *If you need a refill on your cardiac medications before your next appointment, please call your pharmacy*   Lab Work: NONE If you have labs (blood work) drawn today and your tests are completely normal, you will receive your results only by: MyChart Message (if you have MyChart) OR A paper copy in the mail If you have any lab test that is abnormal or we need to change your treatment, we will call you to review the results.   Testing/Procedures: NONE   Follow-Up: At [facility], you and your health needs are our priority.  As part of our continuing mission to provide you with exceptional heart care, we have created designated Provider Care Teams.  These Care Teams include your primary Cardiologist (physician) and Advanced Practice Providers (APPs -  Physician Assistants and Nurse Practitioners) who all work together to provide you with the care you need, when you need it.  We recommend signing up for the patient portal called "MyChart".  Sign up information is provided on this After Visit Summary.  MyChart is used to connect with patients for Virtual Visits (Telemedicine).  Patients are able to view lab/test results, encounter notes, upcoming appointments, etc.  Non-urgent messages can be sent to your provider as well.   To learn more about what you can do with MyChart, go to [email or web address].    Your next appointment:   6 week(s)  The format for your next appointment:   Virtual Visit   Provider:   Thomasene Ripple, DO

## 2021-07-29 NOTE — Progress Notes (Addendum)
Cardio-Obstetrics Clinic  Follow Up Note   Date:  08/02/2021   ID:  Suzanne Mendoza, DOB November 04, 1980, MRN [id number]  PCP:  System, Provider Not In   [facility] Providers Cardiologist:  Thomasene Ripple, DO  Electrophysiologist:  None        Referring MD: No ref. provider found   Chief Complaint: " I am doing on ok:"  The patient is at home. I am in the clinic.  Virtual Visit via Video  Note . I connected with the patient today by a   video enabled telemedicine application and verified that I am speaking with the correct person using two identifiers.   History of Present Illness:    Suzanne Mendoza is a 41 y.o. female [G1P0] who returns for follow up of hypertension in pregnancy.  She was seen on 02/10/2021 at that time she was referred for chronic hypertension in pregnancy. Due her visit she was on procardia 60 mg daily which was keeping her blood pressure to target. We talked about the importance of taking the medication as well.   Today she offers no complaints.    Prior CV Studies Reviewed: The following studies were reviewed today:   Past Medical History:  Diagnosis Date   Allergic rhinitis    Anemia    Asthma    Hypertension    Interstitial cystitis    Migraine     Past Surgical History:  Procedure Laterality Date   none        OB History     Gravida  1   Para      Term      Preterm      AB      Living         SAB      IAB      Ectopic      Multiple      Live Births                  Current Medications: Current Meds  Medication Sig   aspirin EC 81 MG tablet Take 1 tablet (81 mg total) by mouth daily. Swallow whole.   cyclobenzaprine (FLEXERIL) 10 MG tablet Take 10 mg by mouth at bedtime as needed.     famotidine (PEPCID) 20 MG tablet 2 (two) times daily.   NIFEdipine (PROCARDIA XL/NIFEDICAL XL) 60 MG 24 hr tablet Take 60 mg by mouth daily.   Prenatal Vit-Fe Fumarate-FA (PRENATAL PO) Take by mouth daily.    promethazine (PHENERGAN) 25 MG tablet Take 25 mg by mouth every 6 (six) hours as needed for nausea or vomiting.     Allergies:   Cephalosporins, Pyridium [phenazopyridine hcl], Sulfa antibiotics, and Tramadol   Social History   Socioeconomic History   Marital status: Married    Spouse name: Annia Belt   Number of children: Not on file   Years of education: Not on file   Highest education level: Not on file  Occupational History   Not on file  Tobacco Use   Smoking status: Former    Packs/day: 0.30    Years: 10.00    Total pack years: 3.00    Types: Cigarettes    Quit date: 02/03/2008    Years since quitting: 13.5   Smokeless tobacco: Never  Vaping Use   Vaping Use: Never used  Substance and Sexual Activity   Alcohol use: No   Drug use: Never   Sexual activity: Yes    Birth control/protection:  None  Other Topics Concern   Not on file  Social History Narrative   Not on file   Social Determinants of Health   Financial Resource Strain: Not on file  Food Insecurity: No Food Insecurity (02/10/2021)   Hunger Vital Sign    Worried About Running Out of Food in the Last Year: Never true    Ran Out of Food in the Last Year: Never true  Transportation Needs: No Transportation Needs (02/10/2021)   PRAPARE - Administrator, Civil Service (Medical): No    Lack of Transportation (Non-Medical): No  Physical Activity: Not on file  Stress: Not on file  Social Connections: Not on file      Family History  Problem Relation Age of Onset   Hypothyroidism Mother    Hypertension Father    Stroke Father    Hyperlipidemia Father    Cancer Brother        metastatic renal cell, died age 73   Cancer Maternal Grandmother        breast   Cancer Paternal Grandmother        breast      ROS:   Please see the history of present illness.     All other systems reviewed and are negative.   Labs/EKG Reviewed:    EKG:   EKG is was not  ordered today.   Recent  Labs: 08/02/2021: Hemoglobin 11.5; Platelets 176   Recent Lipid Panel No results found for: "CHOL", "TRIG", "HDL", "CHOLHDL", "LDLCALC", "LDLDIRECT"  Physical Exam:    VS:  Ht 5' 3.5" (1.613 m)   Wt 156 lb (70.8 kg)   LMP 11/05/2020   BMI 27.20 kg/m     Wt Readings from Last 3 Encounters:  08/02/21 157 lb 11.5 oz (71.5 kg)  07/29/21 156 lb (70.8 kg)  05/06/21 152 lb 3.2 oz (69 kg)     GEN:  Well nourished, well developed in no acute distress HEENT: Normal NECK: No JVD; No carotid bruits LYMPHATICS: No lymphadenopathy CARDIAC: RRR, no murmurs, rubs, gallops RESPIRATORY:  Clear to auscultation without rales, wheezing or rhonchi  ABDOMEN: Soft, non-tender, non-distended MUSCULOSKELETAL:  No edema; No deformity  SKIN: Warm and dry NEUROLOGIC:  Alert and oriented x 3 PSYCHIATRIC:  Normal affect    Risk Assessment/Risk Calculators:     CARPREG II Risk Prediction Index Score:  1.  The patient's risk for a primary cardiac event is 5%.            ASSESSMENT & PLAN:    Chronic hypertension in pregnancy   She is here her blood pressure with me that she has been taken there all below target.  Was very happy about this.  We will keep her on her current medication regimen. Anticipating induction in the upcoming weeks. I have also educated the patient to continue take her blood pressure postpartum and notify me immediately if her blood pressure is greater than 140/90 mmHg.  Total time spent: 12 Minutes.  Patient Instructions  Medication Instructions:  Your physician recommends that you continue on your current medications as directed. Please refer to the Current Medication list given to you today.  *If you need a refill on your cardiac medications before your next appointment, please call your pharmacy*   Lab Work: NONE If you have labs (blood work) drawn today and your tests are completely normal, you will receive your results only by: MyChart Message (if you have  MyChart) OR A paper copy in  the mail If you have any lab test that is abnormal or we need to change your treatment, we will call you to review the results.   Testing/Procedures: NONE   Follow-Up: At [facility], you and your health needs are our priority.  As part of our continuing mission to provide you with exceptional heart care, we have created designated Provider Care Teams.  These Care Teams include your primary Cardiologist (physician) and Advanced Practice Providers (APPs -  Physician Assistants and Nurse Practitioners) who all work together to provide you with the care you need, when you need it.  We recommend signing up for the patient portal called "MyChart".  Sign up information is provided on this After Visit Summary.  MyChart is used to connect with patients for Virtual Visits (Telemedicine).  Patients are able to view lab/test results, encounter notes, upcoming appointments, etc.  Non-urgent messages can be sent to your provider as well.   To learn more about what you can do with MyChart, go to [email or web address].    Your next appointment:   6 week(s)  The format for your next appointment:   Virtual Visit   Provider:   Thomasene Ripple, DO     Dispo:  Return in about 6 weeks (around 09/09/2021).   Medication Adjustments/Labs and Tests Ordered: Current medicines are reviewed at length with the patient today.  Concerns regarding medicines are outlined above.  Tests Ordered: No orders of the defined types were placed in this encounter.  Medication Changes: No orders of the defined types were placed in this encounter.

## 2021-08-02 ENCOUNTER — Inpatient Hospital Stay (HOSPITAL_COMMUNITY)
Admission: AD | Admit: 2021-08-02 | Discharge: 2021-08-05 | DRG: 807 | Disposition: A | Discharge status: Home / Self Care | Payer: BC Managed Care – PPO | Attending: Obstetrics and Gynecology | Admitting: Obstetrics and Gynecology

## 2021-08-02 ENCOUNTER — Other Ambulatory Visit: Payer: Self-pay

## 2021-08-02 ENCOUNTER — Encounter (HOSPITAL_COMMUNITY): Payer: Self-pay | Admitting: Obstetrics and Gynecology

## 2021-08-02 ENCOUNTER — Inpatient Hospital Stay (HOSPITAL_COMMUNITY): Payer: BC Managed Care – PPO

## 2021-08-02 ENCOUNTER — Inpatient Hospital Stay (HOSPITAL_COMMUNITY)
Admission: AD | Admit: 2021-08-02 | Payer: BC Managed Care – PPO | Source: Home / Self Care | Admitting: Obstetrics and Gynecology

## 2021-08-02 DIAGNOSIS — Z3A38 38 weeks gestation of pregnancy: Secondary | ICD-10-CM | POA: Diagnosis not present

## 2021-08-02 DIAGNOSIS — O1002 Pre-existing essential hypertension complicating childbirth: Secondary | ICD-10-CM | POA: Diagnosis present

## 2021-08-02 DIAGNOSIS — Z3490 Encounter for supervision of normal pregnancy, unspecified, unspecified trimester: Principal | ICD-10-CM

## 2021-08-02 DIAGNOSIS — Z87891 Personal history of nicotine dependence: Secondary | ICD-10-CM

## 2021-08-02 LAB — TYPE AND SCREEN
ABO/RH(D): B NEG
Antibody Screen: POSITIVE

## 2021-08-02 LAB — RPR: RPR Ser Ql: NONREACTIVE

## 2021-08-02 LAB — CBC
HCT: 32.1 % — ABNORMAL LOW (ref 36.0–46.0)
Hemoglobin: 11.5 g/dL — ABNORMAL LOW (ref 12.0–15.0)
MCH: 32.4 pg (ref 26.0–34.0)
MCHC: 35.8 g/dL (ref 30.0–36.0)
MCV: 90.4 fL (ref 80.0–100.0)
Platelets: 176 10*3/uL (ref 150–400)
RBC: 3.55 MIL/uL — ABNORMAL LOW (ref 3.87–5.11)
RDW: 12.6 % (ref 11.5–15.5)
WBC: 6.9 10*3/uL (ref 4.0–10.5)
nRBC: 0 % (ref 0.0–0.2)

## 2021-08-02 MED ORDER — ONDANSETRON HCL 4 MG/2ML IJ SOLN
4.0000 mg | Freq: Four times a day (QID) | INTRAMUSCULAR | Status: DC | PRN
  Administered 2021-08-03: 4 mg via INTRAVENOUS
  Filled 2021-08-02: qty 2

## 2021-08-02 MED ORDER — PHENYLEPHRINE 80 MCG/ML (10ML) SYRINGE FOR IV PUSH (FOR BLOOD PRESSURE SUPPORT)
80.0000 ug | PREFILLED_SYRINGE | INTRAVENOUS | Status: AC | PRN
  Administered 2021-08-03 (×3): 80 ug via INTRAVENOUS
  Filled 2021-08-02: qty 10

## 2021-08-02 MED ORDER — EPHEDRINE 5 MG/ML INJ: 10.0000 mg | INTRAVENOUS | Status: DC | PRN

## 2021-08-02 MED ORDER — OXYTOCIN-SODIUM CHLORIDE 30-0.9 UT/500ML-% IV SOLN
2.5000 [IU]/h | INTRAVENOUS | Status: DC
  Administered 2021-08-03: 2.5 [IU]/h via INTRAVENOUS
  Filled 2021-08-02: qty 500

## 2021-08-02 MED ORDER — LOPERAMIDE HCL 2 MG PO CAPS
2.0000 mg | ORAL_CAPSULE | ORAL | Status: DC | PRN
  Administered 2021-08-02: 2 mg via ORAL
  Filled 2021-08-02: qty 1

## 2021-08-02 MED ORDER — OXYTOCIN BOLUS FROM INFUSION
333.0000 mL | Freq: Once | INTRAVENOUS | Status: AC
  Administered 2021-08-03: 333 mL via INTRAVENOUS

## 2021-08-02 MED ORDER — NIFEDIPINE ER OSMOTIC RELEASE 30 MG PO TB24
60.0000 mg | ORAL_TABLET | Freq: Every day | ORAL | Status: DC
  Filled 2021-08-02: qty 1

## 2021-08-02 MED ORDER — SOD CITRATE-CITRIC ACID 500-334 MG/5ML PO SOLN
30.0000 mL | ORAL | Status: DC | PRN
Start: 2021-08-02 — End: 2021-08-04

## 2021-08-02 MED ORDER — OXYCODONE-ACETAMINOPHEN 5-325 MG PO TABS: 1.0000 | ORAL_TABLET | ORAL | Status: DC | PRN

## 2021-08-02 MED ORDER — FENTANYL CITRATE (PF) 100 MCG/2ML IJ SOLN
50.0000 ug | INTRAMUSCULAR | Status: DC | PRN
  Administered 2021-08-02 (×3): 100 ug via INTRAVENOUS
  Administered 2021-08-02: 50 ug via INTRAVENOUS
  Administered 2021-08-03 (×2): 100 ug via INTRAVENOUS
  Filled 2021-08-02 (×7): qty 2

## 2021-08-02 MED ORDER — MISOPROSTOL 50MCG HALF TABLET: 50.0000 ug | ORAL_TABLET | ORAL | Status: DC

## 2021-08-02 MED ORDER — NALBUPHINE HCL 10 MG/ML IJ SOLN
5.0000 mg | Freq: Once | INTRAMUSCULAR | Status: AC
  Administered 2021-08-02: 5 mg via INTRAVENOUS
  Filled 2021-08-02: qty 1

## 2021-08-02 MED ORDER — LIDOCAINE HCL (PF) 1 % IJ SOLN: 30.0000 mL | INTRAMUSCULAR | Status: DC | PRN

## 2021-08-02 MED ORDER — FENTANYL-BUPIVACAINE-NACL 0.5-0.125-0.9 MG/250ML-% EP SOLN
12.0000 mL/h | EPIDURAL | Status: DC | PRN
  Administered 2021-08-03: 12 mL/h via EPIDURAL
  Filled 2021-08-02 (×2): qty 250

## 2021-08-02 MED ORDER — DIPHENHYDRAMINE HCL 50 MG/ML IJ SOLN: 12.5000 mg | INTRAMUSCULAR | Status: DC | PRN

## 2021-08-02 MED ORDER — PHENYLEPHRINE 80 MCG/ML (10ML) SYRINGE FOR IV PUSH (FOR BLOOD PRESSURE SUPPORT): 80.0000 ug | PREFILLED_SYRINGE | INTRAVENOUS | Status: DC | PRN

## 2021-08-02 MED ORDER — OXYCODONE-ACETAMINOPHEN 5-325 MG PO TABS: 2.0000 | ORAL_TABLET | ORAL | Status: DC | PRN

## 2021-08-02 MED ORDER — LACTATED RINGERS IV SOLN
500.0000 mL | INTRAVENOUS | Status: DC | PRN
  Administered 2021-08-02 – 2021-08-03 (×3): 500 mL via INTRAVENOUS

## 2021-08-02 MED ORDER — LACTATED RINGERS IV SOLN: INTRAVENOUS | Status: DC

## 2021-08-02 MED ORDER — LACTATED RINGERS IV SOLN
500.0000 mL | Freq: Once | INTRAVENOUS | Status: AC
  Administered 2021-08-03: 500 mL via INTRAVENOUS

## 2021-08-02 MED ORDER — ACETAMINOPHEN 325 MG PO TABS
650.0000 mg | ORAL_TABLET | ORAL | Status: DC | PRN
Start: 2021-08-02 — End: 2021-08-04
  Administered 2021-08-02 – 2021-08-03 (×5): 650 mg via ORAL
  Filled 2021-08-02 (×5): qty 2

## 2021-08-02 MED ORDER — OXYTOCIN-SODIUM CHLORIDE 30-0.9 UT/500ML-% IV SOLN
1.0000 m[IU]/min | INTRAVENOUS | Status: DC
  Administered 2021-08-02 – 2021-08-03 (×2): 2 m[IU]/min via INTRAVENOUS

## 2021-08-02 MED ORDER — TERBUTALINE SULFATE 1 MG/ML IJ SOLN: 0.2500 mg | Freq: Once | INTRAMUSCULAR | Status: DC | PRN

## 2021-08-02 NOTE — Progress Notes (Signed)
Pt stable, had a decel before any medicine started.    FHTs 140s, gSTV, NST R Toco q3-6

## 2021-08-02 NOTE — H&P (Signed)
41 y.o. [redacted]w[redacted]d  G1P0 comes in for induction at term for AMA and CHTN,   Otherwise has good fetal movement and no bleeding.  Past Medical History:  Diagnosis Date   Allergic rhinitis    Anemia    Asthma    Hypertension    Interstitial cystitis    Migraine     Past Surgical History:  Procedure Laterality Date   none      OB History  Gravida Para Term Preterm AB Living  1            SAB IAB Ectopic Multiple Live Births               # Outcome Date GA Lbr Len/2nd Weight Sex Delivery Anes PTL Lv  1 Current             Social History   Socioeconomic History   Marital status: Married    Spouse name: Not on file   Number of children: Not on file   Years of education: Not on file   Highest education level: Not on file  Occupational History   Not on file  Tobacco Use   Smoking status: Former    Packs/day: 0.30    Years: 10.00    Total pack years: 3.00    Types: Cigarettes    Quit date: 02/03/2008    Years since quitting: 13.5   Smokeless tobacco: Never  Vaping Use   Vaping Use: Never used  Substance and Sexual Activity   Alcohol use: No   Drug use: Not on file   Sexual activity: Yes    Birth control/protection: None  Other Topics Concern   Not on file  Social History Narrative   Not on file   Social Determinants of Health   Financial Resource Strain: Not on file  Food Insecurity: No Food Insecurity (02/10/2021)   Hunger Vital Sign    Worried About Running Out of Food in the Last Year: Never true    Ran Out of Food in the Last Year: Never true  Transportation Needs: No Transportation Needs (02/10/2021)   PRAPARE - Administrator, Civil Service (Medical): No    Lack of Transportation (Non-Medical): No  Physical Activity: Not on file  Stress: Not on file  Social Connections: Not on file  Intimate Partner Violence: Not on file   Cephalosporins, Pyridium [phenazopyridine hcl], Sulfa antibiotics, and Tramadol    Prenatal Transfer Tool  Maternal Diabetes:  No Genetic Screening: Normal Maternal Ultrasounds/Referrals: Normal Fetal Ultrasounds or other Referrals:  None Maternal Substance Abuse:  No Significant Maternal Medications:  Meds include: Other:  Significant Maternal Lab Results: Xanax and dexadrine in early pregnancy, stopped.  Procardia 60 mg XL.Group B Strep negative  Other PNC: otherwise uncomplicated.    There were no vitals filed for this visit.  Lungs/Cor:  NAD Abdomen:  soft, gravid Ex:  no cords, erythema SVE:  1/20/-2 FHTs:  120, good STV, NST R; Cat 1 tracing. Toco:  q occ   A/P   41 yo G1 CHTN and AMA  GBS neg.  Loney Laurence

## 2021-08-02 NOTE — Progress Notes (Signed)
OB Progress Note  S: some cramping   O: Today's Vitals   08/02/21 1525 08/02/21 1713 08/02/21 1837 08/02/21 1915  BP: 124/87 (!) 121/94 (!) 115/94   Pulse: 99 80 78   Resp: 16  18   Temp:   98.1 F (36.7 C)   TempSrc:   Oral   SpO2:      Weight:      Height:      PainSc:    3    Body mass index is 27.5 kg/m.  SVE 1.5/50/-3, intracervical Foley balloon placed with 28ml saline  FHR: 125bpm, mod variability, + accels, had 2 min late decel to 80-90s while flat on back for foley balloon placement, resolved with position change to lateral Toco: ctx q 3-4 min   A/P: 41Y G1P0 @ [redacted]w[redacted]d, IOL CHTN, AMA Fetal wellbeing: overall reassuring, most recent decel resolved with position change, earlier today had two episodes of prolonged decels, but has otherwise had a reassuring fetal heart tracing throughout the day shift. We discussed resuscitative measures as needed for decelerations, but if there is an instance where tracing does not resolve, would proceed with cesarean. Patient and husband stated understanding.  IOL: due to earlier decelerations, decision made to avoid cytotec and was started on pitocin. She has slowly progressed from 1 to 1.5cm today, foley balloon now placed. Will continue low dose pitocin.  CHTN: on Procardia XL 60mg  daily, blood pressures normal to mildly elevated Pain control: IV fentanyl given for balloon placement, can have epidural upon request  M. , MD 08/02/21 8:12 PM

## 2021-08-03 ENCOUNTER — Inpatient Hospital Stay (HOSPITAL_COMMUNITY): Payer: BC Managed Care – PPO | Admitting: Anesthesiology

## 2021-08-03 ENCOUNTER — Encounter (HOSPITAL_COMMUNITY): Payer: Self-pay | Admitting: Obstetrics and Gynecology

## 2021-08-03 MED ORDER — LIDOCAINE-EPINEPHRINE (PF) 2 %-1:200000 IJ SOLN
INTRAMUSCULAR | Status: DC | PRN
  Administered 2021-08-03 (×2): 4 mL via EPIDURAL

## 2021-08-03 MED ORDER — NALBUPHINE HCL 10 MG/ML IJ SOLN
5.0000 mg | Freq: Once | INTRAMUSCULAR | Status: AC
  Administered 2021-08-03: 5 mg via INTRAVENOUS
  Filled 2021-08-03: qty 1

## 2021-08-03 MED ORDER — LIDOCAINE HCL (PF) 1 % IJ SOLN
INTRAMUSCULAR | Status: DC | PRN
  Administered 2021-08-03: 11 mL via EPIDURAL

## 2021-08-03 MED ORDER — BUPIVACAINE HCL (PF) 0.25 % IJ SOLN
INTRAMUSCULAR | Status: DC | PRN
  Administered 2021-08-03: 8 mL via EPIDURAL

## 2021-08-03 MED ORDER — NALBUPHINE HCL 10 MG/ML IJ SOLN
2.5000 mg | INTRAMUSCULAR | Status: DC | PRN
  Administered 2021-08-03 (×2): 2.5 mg via INTRAVENOUS
  Filled 2021-08-03: qty 1

## 2021-08-03 MED ORDER — FENTANYL CITRATE (PF) 100 MCG/2ML IJ SOLN
INTRAMUSCULAR | Status: DC | PRN
  Administered 2021-08-03: 100 ug via EPIDURAL

## 2021-08-03 MED ORDER — NALBUPHINE HCL 10 MG/ML IJ SOLN
5.0000 mg | Freq: Once | INTRAMUSCULAR | Status: DC
  Filled 2021-08-03: qty 1

## 2021-08-03 MED ORDER — NALBUPHINE HCL 10 MG/ML IJ SOLN
5.0000 mg | INTRAMUSCULAR | Status: DC | PRN
  Administered 2021-08-03: 5 mg via INTRAVENOUS
  Filled 2021-08-03: qty 1

## 2021-08-03 NOTE — Progress Notes (Signed)
Comfortable with epidural Afeb, VSS, BP nl FHT-120-130, mod variability, + accels, some early and variable decels, Cat II, ctx q 3-4 min VE-5/70/-1, vtx, IUPC inserted Continue pitocin, monitor progress, anticipate SVD

## 2021-08-03 NOTE — Lactation Note (Signed)
This note was copied from a baby's chart. Lactation Consultation Note  Patient Name: Suzanne Mendoza LOVFI'E Date: 08/03/2021 Reason for consult: L&D Initial assessment;Primapara;Early term 37-38.6wks Age:41 hours Assisted baby to the breast. Baby latched well after a few tries. Suggested BF STS. Discussed BF positioning options. Answered questions breast feeding parent had. Mom will be seen on MBU today.  Maternal Data    Feeding    LATCH Score Latch: Grasps breast easily, tongue down, lips flanged, rhythmical sucking.  Audible Swallowing: None  Type of Nipple: Everted at rest and after stimulation  Comfort (Breast/Nipple): Soft / non-tender  Hold (Positioning): Assistance needed to correctly position infant at breast and maintain latch.  LATCH Score: 7   Lactation Tools Discussed/Used    Interventions    Discharge    Consult Status Consult Status: Follow-up from L&D Date: 08/04/21 Follow-up type: In-patient    Charyl Dancer 08/03/2021, 11:57 PM

## 2021-08-03 NOTE — Anesthesia Preprocedure Evaluation (Signed)
Anesthesia Evaluation    Airway Mallampati: II  TM Distance: >3 FB Neck ROM: Full    Dental no notable dental hx.    Pulmonary former smoker,    Pulmonary exam normal breath sounds clear to auscultation       Cardiovascular hypertension, Normal cardiovascular exam Rhythm:Regular Rate:Normal     Neuro/Psych    GI/Hepatic   Endo/Other    Renal/GU      Musculoskeletal   Abdominal   Peds  Hematology   Anesthesia Other Findings   Reproductive/Obstetrics (+) Pregnancy                             Anesthesia Physical Anesthesia Plan  ASA: 2  Anesthesia Plan: Epidural   Post-op Pain Management:    Induction:   PONV Risk Score and Plan:   Airway Management Planned:   Additional Equipment:   Intra-op Plan:   Post-operative Plan:   Informed Consent:   Plan Discussed with:   Anesthesia Plan Comments:         Anesthesia Quick Evaluation

## 2021-08-03 NOTE — Anesthesia Procedure Notes (Addendum)
Epidural Patient location during procedure: OB Start time: 08/03/2021 6:23 PM End time: 08/03/2021 6:30 PM  Staffing Anesthesiologist: Mal Amabile, MD Performed: anesthesiologist   Preanesthetic Checklist Completed: patient identified, IV checked, site marked, risks and benefits discussed, surgical consent, monitors and equipment checked, pre-op evaluation and timeout performed  Epidural Patient position: sitting Prep: DuraPrep and site prepped and draped Patient monitoring: continuous pulse ox and blood pressure Approach: midline Location: L3-L4 Injection technique: LOR air  Needle:  Needle type: Tuohy  Needle gauge: 17 G Needle length: 9 cm and 9 Needle insertion depth: 4 cm Catheter type: closed end flexible Catheter size: 19 Gauge Catheter at skin depth: 9 cm Test dose: negative and Other  Assessment Events: blood not aspirated, injection not painful, no injection resistance, no paresthesia and negative IV test  Additional Notes Patient identified. Risks and benefits discussed including failed block, incomplete  Pain control, post dural puncture headache, nerve damage, paralysis, blood pressure Changes, nausea, vomiting, reactions to medications-both toxic and allergic and post Partum back pain. All questions were answered. Patient expressed understanding and wished to proceed. Sterile technique was used throughout procedure. Epidural site was Dressed with sterile barrier dressing. No paresthesias, signs of intravascular injection Or signs of intrathecal spread were encountered.  Patient was more comfortable after the epidural was dosed. Please see RN's note for documentation of vital signs and FHR which are stable. Reason for block:procedure for pain

## 2021-08-03 NOTE — Anesthesia Procedure Notes (Signed)
Epidural Patient location during procedure: OB Start time: 08/03/2021 1:58 AM End time: 08/03/2021 2:19 AM  Staffing Anesthesiologist: Lowella Curb, MD Performed: anesthesiologist   Preanesthetic Checklist Completed: patient identified, IV checked, site marked, risks and benefits discussed, surgical consent, monitors and equipment checked, pre-op evaluation and timeout performed  Epidural Patient position: sitting Prep: ChloraPrep Patient monitoring: heart rate, cardiac monitor, continuous pulse ox and blood pressure Approach: midline Location: L2-L3 Injection technique: LOR saline  Needle:  Needle type: Tuohy  Needle gauge: 17 G Needle length: 9 cm Needle insertion depth: 4 cm Catheter type: closed end flexible Catheter size: 20 Guage Catheter at skin depth: 8 cm Test dose: negative  Assessment Events: blood not aspirated, injection not painful, no injection resistance, no paresthesia and negative IV test  Additional Notes Reason for block:procedure for pain

## 2021-08-03 NOTE — Progress Notes (Signed)
Comfortable with epidural Afeb, VSS, BP nl FHT-130s, Cat I, ctx q 3-4 min VE-5/50/-2, vtx, AROM, maybe light mec Continue pitocin, anticipate SVD

## 2021-08-03 NOTE — Plan of Care (Signed)

## 2021-08-03 NOTE — Progress Notes (Signed)
Comfortable with new epidural, was having significant pain Afeb, VSS FHT-120s, mod variability, + accels, some early and variable decels, had a prolonged decel also, ctx q 4-5 min, Cat II VE-9/80/0, vtx Protracted active phase-had to stop pitocin for awhile for a prolonged decel, now back up to 8 mu/min.  Also had to replace epidural.  Will continue pitocin, monitor progress for now

## 2021-08-04 MED ORDER — OXYCODONE HCL 5 MG PO TABS: 10.0000 mg | ORAL_TABLET | ORAL | Status: DC | PRN

## 2021-08-04 MED ORDER — TETANUS-DIPHTH-ACELL PERTUSSIS 5-2.5-18.5 LF-MCG/0.5 IM SUSY: 0.5000 mL | PREFILLED_SYRINGE | Freq: Once | INTRAMUSCULAR | Status: DC

## 2021-08-04 MED ORDER — WITCH HAZEL-GLYCERIN EX PADS
1.0000 | MEDICATED_PAD | CUTANEOUS | Status: DC | PRN
  Administered 2021-08-05: 1 via TOPICAL

## 2021-08-04 MED ORDER — ZOLPIDEM TARTRATE 5 MG PO TABS: 5.0000 mg | ORAL_TABLET | Freq: Every evening | ORAL | Status: DC | PRN

## 2021-08-04 MED ORDER — ONDANSETRON HCL 4 MG PO TABS: 4.0000 mg | ORAL_TABLET | ORAL | Status: DC | PRN

## 2021-08-04 MED ORDER — NIFEDIPINE ER OSMOTIC RELEASE 30 MG PO TB24
60.0000 mg | ORAL_TABLET | Freq: Every day | ORAL | Status: DC
  Administered 2021-08-04 – 2021-08-05 (×2): 60 mg via ORAL
  Filled 2021-08-04 (×2): qty 2

## 2021-08-04 MED ORDER — OXYCODONE HCL 5 MG PO TABS
5.0000 mg | ORAL_TABLET | ORAL | Status: DC | PRN
  Administered 2021-08-04 – 2021-08-05 (×7): 5 mg via ORAL
  Filled 2021-08-04 (×7): qty 1

## 2021-08-04 MED ORDER — DIBUCAINE (PERIANAL) 1 % EX OINT
1.0000 | TOPICAL_OINTMENT | CUTANEOUS | Status: DC | PRN
  Administered 2021-08-05: 1 via RECTAL
  Filled 2021-08-04: qty 28

## 2021-08-04 MED ORDER — ONDANSETRON HCL 4 MG/2ML IJ SOLN: 4.0000 mg | INTRAMUSCULAR | Status: DC | PRN

## 2021-08-04 MED ORDER — BENZOCAINE-MENTHOL 20-0.5 % EX AERO
1.0000 | INHALATION_SPRAY | CUTANEOUS | Status: DC | PRN
  Administered 2021-08-04 – 2021-08-05 (×2): 1 via TOPICAL
  Filled 2021-08-04 (×2): qty 56

## 2021-08-04 MED ORDER — DIPHENHYDRAMINE HCL 25 MG PO CAPS: 25.0000 mg | ORAL_CAPSULE | Freq: Four times a day (QID) | ORAL | Status: DC | PRN

## 2021-08-04 MED ORDER — MAGNESIUM HYDROXIDE 400 MG/5ML PO SUSP: 30.0000 mL | ORAL | Status: DC | PRN

## 2021-08-04 MED ORDER — MEASLES, MUMPS & RUBELLA VAC IJ SOLR: 0.5000 mL | Freq: Once | INTRAMUSCULAR | Status: DC

## 2021-08-04 MED ORDER — ACETAMINOPHEN 325 MG PO TABS
650.0000 mg | ORAL_TABLET | ORAL | Status: DC | PRN
  Administered 2021-08-04 – 2021-08-05 (×3): 650 mg via ORAL
  Filled 2021-08-04 (×3): qty 2

## 2021-08-04 MED ORDER — COCONUT OIL OIL: 1.0000 | TOPICAL_OIL | Status: DC | PRN

## 2021-08-04 MED ORDER — METHYLERGONOVINE MALEATE 0.2 MG/ML IJ SOLN: 0.2000 mg | INTRAMUSCULAR | Status: DC | PRN

## 2021-08-04 MED ORDER — SENNOSIDES-DOCUSATE SODIUM 8.6-50 MG PO TABS
2.0000 | ORAL_TABLET | Freq: Every day | ORAL | Status: DC
  Administered 2021-08-04 – 2021-08-05 (×2): 2 via ORAL
  Filled 2021-08-04 (×2): qty 2

## 2021-08-04 MED ORDER — PRENATAL MULTIVITAMIN CH
1.0000 | ORAL_TABLET | Freq: Every day | ORAL | Status: DC
  Administered 2021-08-04 – 2021-08-05 (×2): 1 via ORAL
  Filled 2021-08-04 (×2): qty 1

## 2021-08-04 MED ORDER — IBUPROFEN 600 MG PO TABS
600.0000 mg | ORAL_TABLET | Freq: Four times a day (QID) | ORAL | Status: DC
  Administered 2021-08-04 – 2021-08-05 (×7): 600 mg via ORAL
  Filled 2021-08-04 (×7): qty 1

## 2021-08-04 MED ORDER — METHYLERGONOVINE MALEATE 0.2 MG PO TABS: 0.2000 mg | ORAL_TABLET | ORAL | Status: DC | PRN

## 2021-08-04 MED ORDER — SIMETHICONE 80 MG PO CHEW: 80.0000 mg | CHEWABLE_TABLET | ORAL | Status: DC | PRN

## 2021-08-04 NOTE — Lactation Note (Signed)
This note was copied from a baby's chart. Lactation Consultation Note  Patient Name: Suzanne Mendoza [id number] Date: 08/04/2021 Reason for consult: Initial assessment;Early term 37-38.6wks;Primapara;1st time breastfeeding Age:41 hours  P1: Early term infant at 38+6 weeks Feeding preference: Breast  RN in room assisting birth parent when I arrived.  Offered to assist with latching; birth parent receptive.  Taught hand expression and finger fed colostrum drops to "[name]."  Encourage birth parent to continue practicing technique.  Attempted to latch in the cross cradle, however, "[name]" was too sleepy.  Reassurance given.  Suggested parents rest now and demonstrated swaddling per family's request.  "[name]" fell asleep.  Both parents had many questions which I answered to their satisfaction.  Encouraged to feed on cue or at least 8-12 times/24 hours.  Birth parent will call RN/LC for latch assistance as needed.  Continue to educate parents on hand expression, latching, newborn care and how to burp.  Suggested lots of STS for both parents.  Family appreciative.  RN updated.   Maternal Data Has patient been taught Hand Expression?: Yes Does the patient have breastfeeding experience prior to this delivery?: No  Feeding Mother's Current Feeding Choice: Breast Milk  LATCH Score Latch: Too sleepy or reluctant, no latch achieved, no sucking elicited.  Audible Swallowing: None  Type of Nipple: Everted at rest and after stimulation  Comfort (Breast/Nipple): Soft / non-tender  Hold (Positioning): Assistance needed to correctly position infant at breast and maintain latch.  LATCH Score: 5   Lactation Tools Discussed/Used    Interventions Interventions: Breast feeding basics reviewed;Assisted with latch;Skin to skin;Breast massage;Hand express;Expressed milk;Position options;Support pillows;Adjust position;Education;LC Services brochure  Discharge Pump: Personal;Advised to call  insurance company (Birth parent has a specific brand that she desires; will call insurance company to obtain pump) WIC Program: No  Consult Status Consult Status: Follow-up Date: 08/05/21 Follow-up type: In-patient    Aldahir Litaker R Kayle Passarelli 08/04/2021, 4:18 AM

## 2021-08-04 NOTE — Progress Notes (Signed)
Post Partum Day 1 Subjective: no complaints, up ad lib, voiding, and tolerating PO  Objective: Blood pressure 114/82, pulse 63, temperature 98.2 F (36.8 C), temperature source Oral, resp. rate 17, height 5' 3.5" (1.613 m), weight 71.5 kg, last menstrual period 11/05/2020, SpO2 97 %, unknown if currently breastfeeding.  Physical Exam:  General: alert, cooperative, and appears stated age Lochia: appropriate Uterine Fundus: firm DVT Evaluation: No evidence of DVT seen on physical exam.  Recent Labs    08/02/21 0746  HGB 11.5*  HCT 32.1*    Assessment/Plan: Plan for discharge tomorrow Breastfeeding COntinue Procardia 60XL for CHTN   LOS: 2 days   Waynard Reeds, MD 08/04/2021, 11:16 AM

## 2021-08-04 NOTE — Lactation Note (Signed)
This note was copied from a baby's chart. Lactation Consultation Note  Patient Name: Girl Anahli Arvanitis [id number] Date: 08/04/2021 Reason for consult: Follow-up assessment;1st time breastfeeding;Early term 37-38.6wks Age:41 hours  P1, Parents requested assistance with latching. Demonstrated how to provided support for infant's head and guide to breast.  Baby latched with ease and was able to sustain latch for more than 15 min. Discussed cluster feeding and provided pillows for support. Feeding Mother's Current Feeding Choice: Breast Milk  LATCH Score Latch: Grasps breast easily, tongue down, lips flanged, rhythmical sucking.  Audible Swallowing: A few with stimulation  Type of Nipple: Everted at rest and after stimulation  Comfort (Breast/Nipple): Soft / non-tender  Hold (Positioning): Assistance needed to correctly position infant at breast and maintain latch.  LATCH Score: 8    Interventions Interventions: Assisted with latch;Skin to skin;Education  Consult Status Consult Status: Follow-up Date: 08/05/21 Follow-up type: In-patient    Dahlia Byes [facility] 08/04/2021, 8:22 AM

## 2021-08-04 NOTE — Anesthesia Postprocedure Evaluation (Signed)
Anesthesia Post Note  Patient: Suzanne Mendoza  Procedure(s) Performed: AN AD HOC LABOR EPIDURAL     Patient location during evaluation: Mother Baby Anesthesia Type: Epidural Level of consciousness: awake Pain management: satisfactory to patient Vital Signs Assessment: post-procedure vital signs reviewed and stable Respiratory status: spontaneous breathing Cardiovascular status: stable Anesthetic complications: no   No notable events documented.  Last Vitals:  Vitals:   08/04/21 0130 08/04/21 0531  BP: 126/85 105/75  Pulse: 78 63  Resp: 16 17  Temp: 36.9 C 36.8 C  SpO2:      Last Pain:  Vitals:   08/04/21 0531  TempSrc: Oral  PainSc:    Pain Goal: Patients Stated Pain Goal: 0 (08/03/21 1836)                 Cephus Shelling

## 2021-08-05 LAB — BIRTH TISSUE RECOVERY COLLECTION (PLACENTA DONATION)

## 2021-08-05 MED ORDER — RHO D IMMUNE GLOBULIN 1500 UNIT/2ML IJ SOSY
300.0000 ug | PREFILLED_SYRINGE | Freq: Once | INTRAMUSCULAR | Status: AC
  Administered 2021-08-05: 300 ug via INTRAVENOUS
  Filled 2021-08-05: qty 2

## 2021-08-05 MED ORDER — ACETAMINOPHEN 325 MG PO TABS: 650.0000 mg | ORAL_TABLET | ORAL | 0 refills | Status: AC | PRN

## 2021-08-05 MED ORDER — OXYCODONE HCL 5 MG PO TABS: 5.0000 mg | ORAL_TABLET | ORAL | 0 refills | Status: DC | PRN

## 2021-08-05 MED ORDER — IBUPROFEN 600 MG PO TABS
600.0000 mg | ORAL_TABLET | Freq: Four times a day (QID) | ORAL | 0 refills | Status: AC
Start: 2021-08-05 — End: ?

## 2021-08-05 NOTE — Lactation Note (Signed)
This note was copied from a baby's chart. Lactation Consultation Note  Patient Name: Suzanne Mendoza Date: 08/05/2021 Reason for consult: Follow-up assessment;Primapara;1st time breastfeeding;Early term 37-38.6wks (Birthing parent resting on the couch, and mentioned she hadn't eaten breakfast yet. LC enc mom to  take time to eat something, Nurse assessing baby. LC will F/U. per nurse baby has been D/C , waiting on moms.) Age:41 hours  Maternal Data    Feeding Mother's Current Feeding Choice: Breast Milk    Consult Status Consult Status: Follow-up Date: 08/05/21 Follow-up type: In-patient    Suzanne Mendoza 08/05/2021, 11:18 AM

## 2021-08-05 NOTE — Lactation Note (Signed)
This note was copied from a baby's chart. Lactation Consultation Note  Patient Name: Suzanne Mendoza [id number] Date: 08/05/2021 Reason for consult: Follow-up assessment;Early term 37-38.6wks;Primapara;1st time breastfeeding Age:41 hours   P1: Early term infant at 38+6 weeks Feeding preference: Breast  RN requested latch assistance.  Arrived to find RN assisting birth parent with latching.  "[name]" appeared to be latched well, however, RN reported that birth parent felt a little bit of pain.  Asked birth parent to release baby and assisted to latch again.  "[name]" continued to feed with intermittent stimulation for a total of 16 minutes.  When releasing from the breast, birth parent's nipple was rounded and intact.  Discussed nipple sensitivity due to lots of feedings and "[name]" is probably starting to cluster feed now.  Asked birth parent to express colostrum drops and rub into nipple for comfort.  Swaddled "[name]" and suggested parents rest before the next feeding.  RN updated.   Maternal Data    Feeding Mother's Current Feeding Choice: Breast Milk Nipple Type: Slow - flow  LATCH Score Latch: Repeated attempts needed to sustain latch, nipple held in mouth throughout feeding, stimulation needed to elicit sucking reflex.  Audible Swallowing: A few with stimulation  Type of Nipple: Everted at rest and after stimulation  Comfort (Breast/Nipple): Soft / non-tender  Hold (Positioning): Assistance needed to correctly position infant at breast and maintain latch.  LATCH Score: 7   Lactation Tools Discussed/Used    Interventions Interventions: Breast feeding basics reviewed;Assisted with latch;Skin to skin;Breast massage;Hand express;Breast compression;Support pillows;Education  Discharge    Consult Status Consult Status: Follow-up Date: 08/06/21 Follow-up type: In-patient    Tilly Pernice R Dwan Hemmelgarn 08/05/2021, 4:19 AM

## 2021-08-05 NOTE — Lactation Note (Signed)
This note was copied from a baby's chart. Lactation Consultation Note  Patient Name: Suzanne Mendoza Date: 08/05/2021 Reason for consult: Follow-up assessment;Primapara;1st time breastfeeding;Early term 37-38.6wks;Infant weight loss (LC explained the [facility] written plan for D/C and showed mom and dad how to use their DEBP. Mom  and dad expressed they felt better after the [facility] consult assist and the [facility] plan.) Age:9 hours  Maternal Data Has patient been taught Hand Expression?: Yes  Feeding Mother's Current Feeding Choice: Breast Milk  Lactation Tools Discussed/Used Tools: Shells;Pump;Flanges Flange Size: 24 Breast pump type: Manual Pump Education: Setup, frequency, and cleaning;Milk Storage  Discharge Discharge Education: Engorgement and breast care;Warning signs for feeding baby;Outpatient recommendation;Other (comment) (mom prefers to call) Pump: DEBP;Manual;Personal WIC Program: No  Consult Status Consult Status: Complete Date: 08/05/21 Follow-up type: In-patient    Suzanne Mendoza 08/05/2021, 4:01 PM

## 2021-08-05 NOTE — Progress Notes (Signed)
Post Partum Day 2 Subjective: Upon entering room, pt tearful.  She reports she is exhausted but unable to sleep, which is a chronic problem for her.  Prior to pregnancy she used xanax and amitriptyline. Breastfeeding is going well, baby latching but not taking a bottle at all to let mom rest prn.  Bleeding normal.  Some pain at laceration.  Needs breast pump She denies h/o anxiety or depression.  Objective: Blood pressure 125/88, pulse 69, temperature 97.9 F (36.6 C), temperature source Oral, resp. rate 16, height 5' 3.5" (1.613 m), weight 71.5 kg, last menstrual period 11/05/2020, SpO2 94 %, unknown if currently breastfeeding.  Physical Exam:  General: alert and cooperative Lochia: appropriate Uterine Fundus: firm   Assessment/Plan: BP well-controlled on procardia XL 60mg  D/w lactation checking in with patient prior to d/c to help with breast pump and nipple selection for baby to try to take prn.  Also d/w lactation and confirmed pt should be able to take either her phenergan or xanax at home as long as a low dose without any issue for the baby. She will let pt know.   Plan 1 week f/u in office for mental health check and BP check  LOS: 3 days   , MD 08/05/2021, 10:59 AM

## 2021-08-05 NOTE — Lactation Note (Signed)
This note was copied from a baby's chart. Lactation Consultation Note  Patient Name: Girl Rhianon Zabawa [id number] Date: 08/05/2021 Reason for consult: Follow-up assessment;Primapara;1st time breastfeeding;Early term 37-38.6wks;Infant weight loss;Breastfeeding assistance (Baby wide awake and hungry. LC reviewed steps for latching and assisted mom to latch on the [employer]. worked on depth and baby fed for [employer] w/ multiple swallows/ increased w/ breast compressions.) Age:67 hours  Maternal Data Has patient been taught Hand Expression?: Yes  Feeding Mother's Current Feeding Choice: Breast Milk  LATCH Score Latch: Grasps breast easily, tongue down, lips flanged, rhythmical sucking.  Audible Swallowing: Spontaneous and intermittent  Type of Nipple: Everted at rest and after stimulation  Comfort (Breast/Nipple): Filling, red/small blisters or bruises, mild/mod discomfort  Hold (Positioning): Assistance needed to correctly position infant at breast and maintain latch.  LATCH Score: 8   Lactation Tools Discussed/Used Tools: Shells;Pump;Flanges Flange Size: 24 Breast pump type: Manual Pump Education: Setup, frequency, and cleaning;Milk Storage  Interventions Interventions: Breast feeding basics reviewed;Assisted with latch;Skin to skin;Breast massage;Hand express;Pre-pump if needed;Reverse pressure;Breast compression;Adjust position;Support pillows;Position options;Shells;Hand pump;Education;Pace feeding;LC Services brochure  Discharge Discharge Education: Engorgement and breast care;Warning signs for feeding baby Pump: DEBP;Manual;Personal WIC Program: No  Consult Status Consult Status: Follow-up Date: 08/05/21 Follow-up type: In-patient    Matilde Sprang Shavone Nevers 08/05/2021, 3:55 PM

## 2021-08-05 NOTE — Social Work (Signed)
CSW received consult for Suzanne Mendoza unable to sleep due to worrying about the baby, crying and having anxiety. CSW met with Suzanne Mendoza to offer support and complete assessment.    CSW entered the room introduced self, CSW role and explained reason for visit, Suzanne Mendoza was agreeable to visit. CSW inquired about how Suzanne Mendoza was feeling. Suzanne Mendoza stated "I had a little meltdown earlier but I was able to get some sleep so I am feeling better". CSW acknowledged Suzanne Mendoza reported she was feeling overwhelmed and hadn't slept much since being here. CSW inquired about Suzanne Mendoza's mental health history. Suzanne Mendoza reported she has not been diagnosed with anythig since her brother passed away. Suzanne Mendoza repoted that a few years ago when her brother passed away she was diagnosed with depression but it was situational. Suzanne Mendoza reported she was prescribed Celexa and Xanax. Suzanne Mendoza explained she took Celexa for only a brief period but took the Xanax as needed until she found out she was pregnant. Suzanne Mendoza notified CSW she had access to medication and she feels comfortable going back on medication if concerns arise. Suzanne Mendoza reported no other Mental health diagnosis. Suzanne Mendoza denied having therapy and was not interested in therapy at this time. Suzanne Mendoza reported that not sleeping is not a new concern, she has struggled with insomnia even prior to pregnancy. CSW inquired about treatment, Suzanne Mendoza reported she was on a medication prior but has not taken anything for the insomnia in a long time. CSW assessed for safety, Suzanne Mendoza denied any SI or HI. CSW reassured Suzanne Mendoza to utilize her supports and reach out to her PCP or OB if needs arise. Suzanne Mendoza was agreeable to such. Suzanne Mendoza identified her husband (FOB), friends and family as her support.   CSW provided education regarding the baby blues period vs. perinatal mood disorders, discussed treatment and gave resources for mental health follow up if concerns arise.  CSW recommends self-evaluation during the postpartum time period using the New Mom Checklist from Postpartum Progress  and encouraged Suzanne Mendoza to contact a medical professional if symptoms are noted at any time.    CSW provided review of Sudden Infant Death Syndrome (SIDS) precautions. Suzanne Mendoza identified  [facility] for infants follow up care. Suzanne Mendoza reported they have all necessary items for the infant including a bassinet for the infant to sleep.  CSW identifies no further need for intervention and no barriers to discharge at this time.   Letta Kocher, [place] Social Worker [phone]

## 2021-08-06 LAB — RH IG WORKUP (INCLUDES ABO/RH)
Fetal Screen: NEGATIVE
Gestational Age(Wks): 38.6
Unit division: 0

## 2021-08-06 NOTE — Discharge Summary (Signed)
Postpartum Discharge Summary       Patient Name: Suzanne Mendoza DOB: 1980-07-16 MRN: [id number]  Date of admission: 08/02/2021 Delivery date:08/03/2021  Delivering provider: Jackelyn Knife, TODD  Date of discharge: 08/05/21  Admitting diagnosis: Pregnancy [Z34.90] Intrauterine pregnancy: [redacted]w[redacted]d     Secondary diagnosis:  Principal Problem:   Pregnancy  Additional problems: CHTN    Discharge diagnosis: Term Pregnancy Delivered and CHTN                                              Post partum procedures:rhogam Augmentation: Pitocin, Cytotec, and IP Foley Complications: None  Hospital course: Induction of Labor With Vaginal Delivery   41 y.o. yo G1P1001 at [redacted]w[redacted]d was admitted to the hospital 08/02/2021 for induction of labor.  Indication for induction:  CHTN and AMA .  Patient had an uncomplicated labor course as follows: Membrane Rupture Time/Date: 9:04 AM ,08/03/2021   Delivery Method:Vaginal, Spontaneous  Episiotomy: None  Lacerations:  2nd degree  Details of delivery can be found in separate delivery note.  Patient had a postpartum course significant for some increased emotional lability and insomnia. Her CHTN was well-controlled on procardia XL 60mg .   Patient is discharged home on 08/05/21.  Newborn Data: Birth date:08/03/2021  Birth time:10:11 PM  Gender:Female  Living status:Living  Apgars:6 ,8  Weight:2790 g   Physical exam  Vitals:   08/04/21 1307 08/04/21 1508 08/04/21 1959 08/05/21 0533  BP: (!) 128/90 113/85 111/84 125/88  Pulse: 80 79 84 69  Resp: 17 17 16 16   Temp: 98.1 F (36.7 C) 97.7 F (36.5 C) 98.2 F (36.8 C) 97.9 F (36.6 C)  TempSrc: Oral Oral Oral Oral  SpO2: 97% 98% 97% 94%  Weight:      Height:       General: alert and cooperative Lochia: appropriate Uterine Fundus: firm  Labs: Lab Results  Component Value Date   WBC 6.9 08/02/2021   HGB 11.5 (L) 08/02/2021   HCT 32.1 (L) 08/02/2021   MCV 90.4 08/02/2021   PLT 176 08/02/2021      Latest  Ref Rng & Units 11/21/2010   12:35 PM  CMP  Glucose 70 - 99 mg/dL 98   BUN 6 - 23 mg/dL 13   Creatinine 10/02/2021 - 1.10 mg/dL 11/23/2010   Sodium 6.07 - 3.71 mEq/L 139   Potassium 3.5 - 5.3 mEq/L 4.1   Chloride 96 - 112 mEq/L 106   CO2 19 - 32 mEq/L 26   Calcium 8.4 - 10.5 mg/dL 9.3   Total Protein 6.0 - 8.3 g/dL 6.9   Total Bilirubin 0.3 - 1.2 mg/dL 0.8   Alkaline Phos 39 - 117 U/L 56   AST 0 - 37 U/L 19   ALT 0 - 35 U/L 16    Edinburgh Score:    08/05/2021   11:03 AM  Edinburgh Postnatal Depression Scale Screening Tool  I have been able to laugh and see the funny side of things. 0  I have looked forward with enjoyment to things. 1  I have blamed myself unnecessarily when things went wrong. 1  I have been anxious or worried for no good reason. 0  I have felt scared or panicky for no good reason. 0  Things have been getting on top of me. 0  I have been so unhappy that I have had  difficulty sleeping. 0  I have felt sad or miserable. 1  I have been so unhappy that I have been crying. 0  The thought of harming myself has occurred to me. 0  Edinburgh Postnatal Depression Scale Total 3     After visit meds:  Allergies as of 08/05/2021       Reactions   Cephalosporins Hives   Pyridium [phenazopyridine Hcl] Other (See Comments)   Oxidative hemolytic anemia   Sulfa Antibiotics Hives   Ultram [tramadol] Hives        Medication List     STOP taking these medications    aspirin EC 81 MG tablet       TAKE these medications    acetaminophen 325 MG tablet Commonly known as: Tylenol Take 2 tablets (650 mg total) by mouth every 4 (four) hours as needed (for pain scale < 4).   famotidine 20 MG tablet Commonly known as: PEPCID Take 20 mg by mouth 2 (two) times daily as needed for heartburn.   ibuprofen 600 MG tablet Commonly known as: ADVIL Take 1 tablet (600 mg total) by mouth every 6 (six) hours.   NIFEdipine 60 MG 24 hr tablet Commonly known as: PROCARDIA XL/NIFEDICAL  XL Take 60 mg by mouth daily.   oxyCODONE 5 MG immediate release tablet Commonly known as: Oxy IR/ROXICODONE Take 1 tablet (5 mg total) by mouth every 4 (four) hours as needed (pain scale 4-7).   PRENATAL PO Take by mouth daily.   promethazine 25 MG tablet Commonly known as: PHENERGAN Take 25 mg by mouth every 6 (six) hours as needed for nausea or vomiting.         Discharge home in stable condition Infant Feeding: Breast Infant Disposition:home with mother Discharge instruction: per After Visit Summary and Postpartum booklet. Activity: Advance as tolerated. Pelvic rest for 6 weeks.  Diet: routine diet Future Appointments: Future Appointments  Date Time Provider Department Center  09/09/2021 11:00 AM Tobb, Lavona [place]ound, DO [facility]   Follow up Visit:  Follow-up Information     [name], DO. Schedule an appointment as soon as possible for a visit in 1 week(s).   Specialty: Obstetrics and Gynecology Why: Needs one week followup for blood pressure and mental health check Contact information: [address] [place] [address] [phone]                  Please schedule this patient for a In person postpartum visit in 1 week with the following provider: MD. Additional Postpartum F/U:Postpartum Depression checkup and BP check 1 week   Delivery mode:  Vaginal, Spontaneous  Anticipated Birth Control:  Unsure   08/06/2021 Oliver Pila, MD

## 2021-08-13 ENCOUNTER — Telehealth (HOSPITAL_COMMUNITY): Payer: Self-pay

## 2021-08-13 NOTE — Telephone Encounter (Signed)
Patient reports feeling good. Patient declines questions/concerns about her health and healing.  Patient reports that baby is doing well. Eating, peeing/pooping, and gaining weight well. Baby sleeps in a bedside bassinet. RN reviewed ABC's of safe sleep with patient. Patient declines any questions or concerns about baby.  EPDS score is 4.  Marcelino Duster [facility]  08/13/21,1507

## 2021-09-09 ENCOUNTER — Ambulatory Visit: Payer: BC Managed Care – PPO | Attending: Cardiology | Admitting: Cardiology

## 2021-09-09 ENCOUNTER — Encounter: Payer: Self-pay | Admitting: Cardiology

## 2021-09-09 VITALS — BP 124/93 | Ht 63.5 in | Wt 145.0 lb

## 2021-09-09 DIAGNOSIS — O10919 Unspecified pre-existing hypertension complicating pregnancy, unspecified trimester: Secondary | ICD-10-CM

## 2021-09-09 DIAGNOSIS — O165 Unspecified maternal hypertension, complicating the puerperium: Secondary | ICD-10-CM | POA: Diagnosis not present

## 2021-09-09 NOTE — Patient Instructions (Signed)
Medication Instructions:  Your physician recommends that you continue on your current medications as directed. Please refer to the Current Medication list given to you today.   Please take your blood pressure daily for 2 weeks and send in a MyChart message. Please include heart rates. If your blood pressure is greater then 140/90 for two days in a row please contact my office.   HOW TO TAKE YOUR BLOOD PRESSURE: Rest 5 minutes before taking your blood pressure. Don't smoke or drink caffeinated beverages for at least 30 minutes before. Take your blood pressure before (not after) you eat. Sit comfortably with your back supported and both feet on the floor (don't cross your legs). Elevate your arm to heart level on a table or a desk. Use the proper sized cuff. It should fit smoothly and snugly around your bare upper arm. There should be enough room to slip a fingertip under the cuff. The bottom edge of the cuff should be 1 inch above the crease of the elbow. Ideally, take 3 measurements at one sitting and record the average.  *If you need a refill on your cardiac medications before your next appointment, please call your pharmacy*   Lab Work: None   Testing/Procedures: None   Follow-Up: At [facility], you and your health needs are our priority.  As part of our continuing mission to provide you with exceptional heart care, we have created designated Provider Care Teams.  These Care Teams include your primary Cardiologist (physician) and Advanced Practice Providers (APPs -  Physician Assistants and Nurse Practitioners) who all work together to provide you with the care you need, when you need it.  We recommend signing up for the patient portal called "MyChart".  Sign up information is provided on this After Visit Summary.  MyChart is used to connect with patients for Virtual Visits (Telemedicine).  Patients are able to view lab/test results, encounter notes, upcoming appointments,  etc.  Non-urgent messages can be sent to your provider as well.   To learn more about what you can do with MyChart, go to [email or web address].    Your next appointment:   1 year(s)  The format for your next appointment:   In Person  Provider:   Thomasene Ripple, DO     Other Instructions   Important Information About Sugar

## 2021-09-09 NOTE — Progress Notes (Signed)
Cardio-Obstetrics Clinic  Follow Up Note   Date:  09/09/2021   ID:  Suzanne Mendoza, DOB 19-Jul-1980, MRN [id number]  PCP:  System, Provider Not In   [facility] Providers Cardiologist:  Thomasene Ripple, DO  Electrophysiologist:  None   { Click to update primary MD,subspecialty MD or APP then REFRESH:1}     Referring MD: No ref. provider found   The patient is at home. I am in the office.  Virtual Visit via Video  Note . I connected with the patient today by a   video enabled telemedicine application and verified that I am speaking with the correct person using two identifiers.   Chief Complaint: " I am doing ok"  History of Present Illness:    Suzanne Mendoza is a 41 y.o. female [G1P1001] who returns for follow up of chronic in hypertension.  Since I saw the patient she had delivered a baby.  She was taken of the nifedipine by her OB/GYN.  She says her blood pressure has been regulated.   Prior CV Studies Reviewed: The following studies were reviewed today:   Past Medical History:  Diagnosis Date   Allergic rhinitis    Anemia    Asthma    Hypertension    Interstitial cystitis    Migraine     Past Surgical History:  Procedure Laterality Date   none     { Click here to update PMH, PSH, OB Hx then refresh note  :1}   OB History     Gravida  1   Para  1   Term  1   Preterm      AB      Living  1      SAB      IAB      Ectopic      Multiple  0   Live Births  1           { Click here to update OB Charting then refresh note  :1}    Current Medications: Current Meds  Medication Sig   acetaminophen (TYLENOL) 325 MG tablet Take 2 tablets (650 mg total) by mouth every 4 (four) hours as needed (for pain scale < 4).   ibuprofen (ADVIL) 600 MG tablet Take 1 tablet (600 mg total) by mouth every 6 (six) hours.   Prenatal Vit-Fe Fumarate-FA (PRENATAL PO) Take by mouth daily.     Allergies:   Cephalosporins, Pyridium  [phenazopyridine hcl], Sulfa antibiotics, and Ultram [tramadol]   Social History   Socioeconomic History   Marital status: Married    Spouse name: Annia Belt   Number of children: Not on file   Years of education: Not on file   Highest education level: Not on file  Occupational History   Not on file  Tobacco Use   Smoking status: Former    Packs/day: 0.30    Years: 10.00    Total pack years: 3.00    Types: Cigarettes    Quit date: 02/03/2008    Years since quitting: 13.6   Smokeless tobacco: Never  Vaping Use   Vaping Use: Never used  Substance and Sexual Activity   Alcohol use: No   Drug use: Never   Sexual activity: Yes    Birth control/protection: None  Other Topics Concern   Not on file  Social History Narrative   Not on file   Social Determinants of Health   Financial Resource Strain: Not on file  Food  Insecurity: No Food Insecurity (02/10/2021)   Hunger Vital Sign    Worried About Running Out of Food in the Last Year: Never true    Ran Out of Food in the Last Year: Never true  Transportation Needs: No Transportation Needs (02/10/2021)   PRAPARE - Administrator, Civil Service (Medical): No    Lack of Transportation (Non-Medical): No  Physical Activity: Not on file  Stress: Not on file  Social Connections: Not on file  { Click here to update SDOH then refresh :1}    Family History  Problem Relation Age of Onset   Hypothyroidism Mother    Hypertension Father    Stroke Father    Hyperlipidemia Father    Cancer Brother        metastatic renal cell, died age 24   Cancer Maternal Grandmother        breast   Cancer Paternal Grandmother        breast   { Click here to update FH then refresh note    :1}   ROS:   Please see the history of present illness.     All other systems reviewed and are negative.   Labs/EKG Reviewed:    EKG:   EKG is was not ordered today.    Recent Labs: 08/02/2021: Hemoglobin 11.5; Platelets 176   Recent Lipid  Panel No results found for: "CHOL", "TRIG", "HDL", "CHOLHDL", "LDLCALC", "LDLDIRECT"  Physical Exam:    VS:  Ht 5' 3.5" (1.613 m)   Wt 145 lb (65.8 kg)   LMP 11/05/2020   BMI 25.28 kg/m     Wt Readings from Last 3 Encounters:  09/09/21 145 lb (65.8 kg)  08/02/21 157 lb 11.5 oz (71.5 kg)  07/29/21 156 lb (70.8 kg)      Risk Assessment/Risk Calculators:   { Click to calculate CARPREG II - THEN refresh note :1}    { Click to caclulate Mod WHO Class of CV Risk - THEN refresh note :1}     { Click for CHADS2VASc Score - THEN Refresh Note    :[id number]}      ASSESSMENT & PLAN:    Chronic hypertension in pregnancy-her blood pressure seems to have been controlled and she was taking off her nifedipine by her OB/GYN.  Today she is 124/93 mmHg on her home cuff.  I like to keep a close eye on her diastolic as this is elevated.  Given the fact that she have chronic hypertension I want the patient to take her blood pressure for the next 2 weeks admit his information.  Low threshold to start her back on antihypertensive medications.  Total visit time 10 minutes.  The patient is in agreement with the above plan. The patient left the office in stable condition.  The patient will follow up in 1 year  There are no Patient Instructions on file for this visit.   Dispo:  No follow-ups on file.   Medication Adjustments/Labs and Tests Ordered: Current medicines are reviewed at length with the patient today.  Concerns regarding medicines are outlined above.  Tests Ordered: No orders of the defined types were placed in this encounter.  Medication Changes: No orders of the defined types were placed in this encounter.
